# Patient Record
Sex: Female | Born: 1950
Health system: Southern US, Community
[De-identification: ages and names within clinical notes are randomized; demographics above are authoritative.]

## PROBLEM LIST (undated history)

## (undated) DIAGNOSIS — J449 Chronic obstructive pulmonary disease, unspecified: Secondary | ICD-10-CM

## (undated) DIAGNOSIS — C439 Malignant melanoma of skin, unspecified: Secondary | ICD-10-CM

## (undated) DIAGNOSIS — J309 Allergic rhinitis, unspecified: Secondary | ICD-10-CM

## (undated) DIAGNOSIS — D509 Iron deficiency anemia, unspecified: Secondary | ICD-10-CM

## (undated) DIAGNOSIS — J189 Pneumonia, unspecified organism: Secondary | ICD-10-CM

## (undated) DIAGNOSIS — F17200 Nicotine dependence, unspecified, uncomplicated: Secondary | ICD-10-CM

## (undated) DIAGNOSIS — M810 Age-related osteoporosis without current pathological fracture: Secondary | ICD-10-CM

## (undated) DIAGNOSIS — M1990 Unspecified osteoarthritis, unspecified site: Secondary | ICD-10-CM

## (undated) DIAGNOSIS — C4491 Basal cell carcinoma of skin, unspecified: Secondary | ICD-10-CM

## (undated) DIAGNOSIS — K2970 Gastritis, unspecified, without bleeding: Secondary | ICD-10-CM

## (undated) DIAGNOSIS — K219 Gastro-esophageal reflux disease without esophagitis: Secondary | ICD-10-CM

## (undated) DIAGNOSIS — F419 Anxiety disorder, unspecified: Secondary | ICD-10-CM

## (undated) HISTORY — DX: Age-related osteoporosis without current pathological fracture: M81.0

## (undated) HISTORY — DX: Iron deficiency anemia, unspecified: D50.9

## (undated) HISTORY — DX: Nicotine dependence, unspecified, uncomplicated: F17.200

## (undated) HISTORY — DX: Malignant melanoma of skin, unspecified: C43.9

## (undated) HISTORY — DX: Allergic rhinitis, unspecified: J30.9

## (undated) HISTORY — PX: MELANOMA EXCISION: SHX5266

## (undated) HISTORY — DX: Unspecified osteoarthritis, unspecified site: M19.90

## (undated) HISTORY — DX: Anxiety disorder, unspecified: F41.9

## (undated) HISTORY — DX: Chronic obstructive pulmonary disease, unspecified: J44.9

## (undated) HISTORY — DX: Gastritis, unspecified, without bleeding: K29.70

## (undated) HISTORY — PX: EYE SURGERY: SHX253

---

## 1999-11-24 ENCOUNTER — Other Ambulatory Visit: Admission: RE | Admit: 1999-11-24 | Discharge: 1999-11-24 | Payer: Self-pay | Admitting: Gynecology

## 2001-01-29 ENCOUNTER — Other Ambulatory Visit: Admission: RE | Admit: 2001-01-29 | Discharge: 2001-01-29 | Payer: Self-pay | Admitting: Gynecology

## 2002-01-30 ENCOUNTER — Encounter: Admission: RE | Admit: 2002-01-30 | Discharge: 2002-01-30 | Payer: Self-pay | Admitting: Gynecology

## 2002-01-30 ENCOUNTER — Encounter: Payer: Self-pay | Admitting: Gynecology

## 2002-07-22 ENCOUNTER — Other Ambulatory Visit: Admission: RE | Admit: 2002-07-22 | Discharge: 2002-07-22 | Payer: Self-pay | Admitting: Gynecology

## 2003-04-15 ENCOUNTER — Ambulatory Visit (HOSPITAL_COMMUNITY): Admission: RE | Admit: 2003-04-15 | Discharge: 2003-04-15 | Payer: Self-pay | Admitting: Gynecology

## 2003-04-15 ENCOUNTER — Ambulatory Visit (HOSPITAL_BASED_OUTPATIENT_CLINIC_OR_DEPARTMENT_OTHER): Admission: RE | Admit: 2003-04-15 | Discharge: 2003-04-15 | Payer: Self-pay | Admitting: Gynecology

## 2003-04-15 ENCOUNTER — Encounter (INDEPENDENT_AMBULATORY_CARE_PROVIDER_SITE_OTHER): Payer: Self-pay | Admitting: Specialist

## 2003-10-29 ENCOUNTER — Other Ambulatory Visit: Admission: RE | Admit: 2003-10-29 | Discharge: 2003-10-29 | Payer: Self-pay | Admitting: Gynecology

## 2005-02-07 ENCOUNTER — Other Ambulatory Visit: Admission: RE | Admit: 2005-02-07 | Discharge: 2005-02-07 | Payer: Self-pay | Admitting: Gynecology

## 2005-08-31 ENCOUNTER — Encounter: Admission: RE | Admit: 2005-08-31 | Discharge: 2005-08-31 | Payer: Self-pay | Admitting: Gynecology

## 2006-07-24 ENCOUNTER — Other Ambulatory Visit: Admission: RE | Admit: 2006-07-24 | Discharge: 2006-07-24 | Payer: Self-pay | Admitting: Gynecology

## 2010-02-21 ENCOUNTER — Encounter: Payer: Self-pay | Admitting: Gynecology

## 2010-06-18 NOTE — Op Note (Signed)
Linda Davies, Linda Davies                          ACCOUNT NO.:  [id number]   MEDICAL RECORD NO.:  [id number]                   PATIENT TYPE:  AMB   LOCATION:  [facility]                                 FACILITY:  [facility]   PHYSICIAN:  Gretta Cool, M.D.              DATE OF BIRTH:  Aug 08, 1950   DATE OF PROCEDURE:  04/15/2003  DATE OF DISCHARGE:                                 OPERATIVE REPORT   PREOPERATIVE DIAGNOSIS:  Endometrial polyp with abnormal uterine bleeding.   POSTOPERATIVE DIAGNOSIS:  Endometrial polyp with abnormal uterine bleeding.   PROCEDURE:  Hysteroscopy, resection of endometrial polyp, and total  endometrial resection for ablation.   SURGEON:  Gretta Cool, M.D.   ANESTHESIA:  IV sedation and paracervical block.   DESCRIPTION OF PROCEDURE:  Under excellent IV sedation as above with the  patient prepped and draped in Allen's stirrups, the cervix was grasped with  a single tooth tenaculum and progressively dilated with a series of Pratt  dilators to 7 mm #33 Pratt.  At this point, the resectoscope was then  introduced into the endometrial cavity and the cavity photographed.  A large  polyp on the posterior wall of the uterus was identified and resected.  Next, the entire endometrial cavity was resected 360 degrees around the  cavity, and extending a distance of at least  5 mm out into the myometrial tissue.  Once the entire endometrial cavity had  been resected, the corneal areas were treated by touch technique with  Vapotrode and the entire endometrial cavity was then treated by Vapotrode  electrodes, so as to eliminate any islands of viable endometrial tissue.  At  this point, the procedure was terminated without complication.  There was no  significant bleeding or reduced pressure.  The patient returned to the  recovery room in excellent condition.                                               Gretta Cool, M.D.    CWL/MEDQ  D:  04/15/2003  T:  04/15/2003   Job:  [id number]

## 2010-07-05 ENCOUNTER — Other Ambulatory Visit: Payer: Self-pay | Admitting: Gynecology

## 2011-08-23 ENCOUNTER — Other Ambulatory Visit: Payer: Self-pay | Admitting: Gynecology

## 2016-07-04 ENCOUNTER — Ambulatory Visit (HOSPITAL_BASED_OUTPATIENT_CLINIC_OR_DEPARTMENT_OTHER)
Admission: RE | Admit: 2016-07-04 | Discharge: 2016-07-04 | Disposition: A | Discharge status: Home / Self Care | Payer: BC Managed Care – PPO | Source: Ambulatory Visit | Attending: Physician Assistant | Admitting: Physician Assistant

## 2016-07-04 ENCOUNTER — Ambulatory Visit (INDEPENDENT_AMBULATORY_CARE_PROVIDER_SITE_OTHER): Payer: BC Managed Care – PPO | Admitting: Physician Assistant

## 2016-07-04 ENCOUNTER — Encounter: Payer: Self-pay | Admitting: Physician Assistant

## 2016-07-04 VITALS — BP 114/76 | HR 78 | Temp 98.2°F | Resp 18 | Ht 63.39 in | Wt 123.4 lb

## 2016-07-04 DIAGNOSIS — M7989 Other specified soft tissue disorders: Secondary | ICD-10-CM

## 2016-07-04 DIAGNOSIS — M79661 Pain in right lower leg: Secondary | ICD-10-CM

## 2016-07-04 DIAGNOSIS — T148XXA Other injury of unspecified body region, initial encounter: Secondary | ICD-10-CM

## 2016-07-04 NOTE — Patient Instructions (Addendum)
  [place] [facility]  215 PM  [address] [place], [place] [address]   IF you received an x-ray today, you will receive an invoice from [facility]. Please contact [facility] at [phone] with questions or concerns regarding your invoice.   IF you received labwork today, you will receive an invoice from [facility]. Please contact [facility] at [phone] with questions or concerns regarding your invoice.   Our billing staff will not be able to assist you with questions regarding bills from these companies.  You will be contacted with the lab results as soon as they are available. The fastest way to get your results is to activate your My Chart account. Instructions are located on the last page of this paperwork. If you have not heard from [place] regarding the results in 2 weeks, please contact this office.

## 2016-07-04 NOTE — Progress Notes (Signed)
Linda Davies  MRN: [id number] DOB: [birth year]  PCP: Patient, No Pcp Per  Chief Complaint  Patient presents with  . Leg Pain    right leg calf is swollen and sore  x1 week    Subjective:  Pt presents to clinic for pain in her right calf for the last week but increase swelling over the last several days.  She has been using ice and advil and elevation that seems to help with the swelling a little.  Having to change her gait due to pain.  She did not have an injury but when she was walking up and down the steps and felt something like a sting in her calf but nothing that stopped her from continuing to work.  She has been resting and elevating and when she does it feels better - she has also been wearing a compression sock.  She has noticed some swelling in her ankle but she has no pain there.  Drove home from beach (3.5 hours) 1 week ago - no h/o blood clot, non smoker. No estrogen, no family hx clotting disorders  Review of Systems  Constitutional: Negative for chills and fever.  Respiratory: Negative for cough and shortness of breath.   Cardiovascular: Positive for leg swelling. Negative for chest pain.  Psychiatric/Behavioral: Positive for sleep disturbance (due to pain when she touches her right calf with movement in sleep ).    There are no active problems to display for this patient.   No current outpatient prescriptions on file prior to visit.   No current facility-administered medications on file prior to visit.     No Known Allergies  Pt patients past, family and social history were reviewed and updated.   Objective:  BP 114/76   Pulse 78   Temp 98.2 F (36.8 C) (Oral)   Resp 18   Ht 5' 3.39" (1.61 m)   Wt 123 lb 6.4 oz (56 kg)   SpO2 97%   BMI 21.59 kg/m   Physical Exam  Constitutional: She is oriented to person, place, and time and well-developed, well-nourished, and in no distress.  HENT:  Head: Normocephalic and atraumatic.  Right Ear: Hearing and  external ear normal.  Left Ear: Hearing and external ear normal.  Eyes: Conjunctivae are normal.  Neck: Normal range of motion.  Cardiovascular: Normal rate, regular rhythm and normal heart sounds.   No murmur heard. Pulmonary/Chest: Effort normal and breath sounds normal. She has no wheezes.  Musculoskeletal:  Left calf visible larger than the right, no ecchymosis, no erythema or warmth - TTP along the gastrocnemious but palpable cord.  Achilles tender intact with good strength. Neg Homans.  Left calf at 9 cm is 31 cm  At 5 cm 32 cm  Right calf at 9 am is 35 cm at 5 cm 36.5 cm  Neurological: She is alert and oriented to person, place, and time. Gait normal.  Skin: Skin is warm and dry.  Psychiatric: Mood, memory, affect and judgment normal.  Vitals reviewed.   US Venous Img Lower Unilateral Right  Result Date: 07/04/2016 CLINICAL DATA:  Acute right calf pain and swelling for 1 week EXAM: RIGHT LOWER EXTREMITY VENOUS DOPPLER ULTRASOUND TECHNIQUE: Gray-scale sonography with graded compression, as well as color Doppler and duplex ultrasound were performed to evaluate the lower extremity deep venous systems from the level of the common femoral vein and including the common femoral, femoral, profunda femoral, popliteal and calf veins including the posterior tibial, peroneal and gastrocnemius  veins when visible. The superficial great saphenous vein was also interrogated. Spectral Doppler was utilized to evaluate flow at rest and with distal augmentation maneuvers in the common femoral, femoral and popliteal veins. COMPARISON:  None. FINDINGS: Contralateral Common Femoral Vein: Respiratory phasicity is normal and symmetric with the symptomatic side. No evidence of thrombus. Normal compressibility. Common Femoral Vein: No evidence of thrombus. Normal compressibility, respiratory phasicity and response to augmentation. Saphenofemoral Junction: No evidence of thrombus. Normal compressibility and flow on  color Doppler imaging. Profunda Femoral Vein: No evidence of thrombus. Normal compressibility and flow on color Doppler imaging. Femoral Vein: No evidence of thrombus. Normal compressibility, respiratory phasicity and response to augmentation. Popliteal Vein: No evidence of thrombus. Normal compressibility, respiratory phasicity and response to augmentation. Calf Veins: No evidence of thrombus. Normal compressibility and flow on color Doppler imaging. Superficial Great Saphenous Vein: No evidence of thrombus. Normal compressibility and flow on color Doppler imaging. Venous Reflux:  None. Other Findings: Posterior right calf complex elongated hypoechoic and partially cystic area measures 9.6 x 2.1 x 6.4 cm. No associated vascularity. Appearance compatible with soft tissue hematoma. IMPRESSION: No evidence of DVT within the right lower extremity. Findings compatible with posterior right calf soft tissue hematoma. Electronically Signed   By: Jerilynn Mages.  Shick M.D.   On: 07/04/2016 15:23    Assessment and Plan :  Right leg swelling - Plan: US Venous Img Lower Unilateral Right, Care order/instruction:, CANCELED: VAS [place] LOWER EXTREMITY VENOUS (DVT), CANCELED: US Venous Img Lower Unilateral Right  Right calf pain - Plan: US Venous Img Lower Unilateral Right, CANCELED: VAS [place] LOWER EXTREMITY VENOUS (DVT), CANCELED: US Venous Img Lower Unilateral Right  Hematoma from [place] - ? Muscle tear when she was going up and down steps and felt a pull sensation.  Called and spoke patient - she will use compression and heat and elevation - she will recheck with me in a month and at that time we will likely [place] to make sure it is resolved and there is no cause of spontaneous bleed though likely this related to an injury while going up steps.  D/w pt RTC precautions with erythema, warmth or increase pain in the calf area.  Windell Hummingbird PA-C  [facility] 07/04/2016 9:33 PM

## 2017-05-01 DIAGNOSIS — D509 Iron deficiency anemia, unspecified: Secondary | ICD-10-CM

## 2017-05-01 HISTORY — DX: Iron deficiency anemia, unspecified: D50.9

## 2017-05-10 ENCOUNTER — Ambulatory Visit: Payer: BC Managed Care – PPO | Admitting: Family Medicine

## 2017-05-10 ENCOUNTER — Encounter: Payer: Self-pay | Admitting: Family Medicine

## 2017-05-10 VITALS — BP 124/62 | HR 78 | Temp 98.0°F | Resp 16 | Ht 64.25 in | Wt 118.0 lb

## 2017-05-10 DIAGNOSIS — R1013 Epigastric pain: Secondary | ICD-10-CM | POA: Diagnosis not present

## 2017-05-10 DIAGNOSIS — F411 Generalized anxiety disorder: Secondary | ICD-10-CM

## 2017-05-10 LAB — CBC WITH DIFFERENTIAL/PLATELET
BASOS ABS: 0 10*3/uL (ref 0.0–0.1)
BASOS PCT: 0.6 % (ref 0.0–3.0)
EOS ABS: 0.1 10*3/uL (ref 0.0–0.7)
Eosinophils Relative: 2.2 % (ref 0.0–5.0)
HEMATOCRIT: 32 % — AB (ref 36.0–46.0)
Hemoglobin: 10.4 g/dL — ABNORMAL LOW (ref 12.0–15.0)
LYMPHS PCT: 25.9 % (ref 12.0–46.0)
Lymphs Abs: 1.5 10*3/uL (ref 0.7–4.0)
MCHC: 32.5 g/dL (ref 30.0–36.0)
MCV: 83.7 fl (ref 78.0–100.0)
MONOS PCT: 14.5 % — AB (ref 3.0–12.0)
Monocytes Absolute: 0.8 10*3/uL (ref 0.1–1.0)
Neutro Abs: 3.2 10*3/uL (ref 1.4–7.7)
Neutrophils Relative %: 56.8 % (ref 43.0–77.0)
Platelets: 283 10*3/uL (ref 150.0–400.0)
RBC: 3.83 Mil/uL — ABNORMAL LOW (ref 3.87–5.11)
RDW: 15.7 % — AB (ref 11.5–15.5)
WBC: 5.7 10*3/uL (ref 4.0–10.5)

## 2017-05-10 LAB — COMPREHENSIVE METABOLIC PANEL
ALT: 19 U/L (ref 0–35)
AST: 27 U/L (ref 0–37)
Albumin: 4 g/dL (ref 3.5–5.2)
Alkaline Phosphatase: 60 U/L (ref 39–117)
BILIRUBIN TOTAL: 0.3 mg/dL (ref 0.2–1.2)
BUN: 10 mg/dL (ref 6–23)
CALCIUM: 9.4 mg/dL (ref 8.4–10.5)
CO2: 31 mEq/L (ref 19–32)
CREATININE: 0.92 mg/dL (ref 0.40–1.20)
Chloride: 103 mEq/L (ref 96–112)
GFR: 65.26 mL/min (ref >60.00)
Glucose, Bld: 85 mg/dL (ref 70–99)
Potassium: 4.4 mEq/L (ref 3.5–5.1)
Sodium: 139 mEq/L (ref 135–145)
Total Protein: 6.4 g/dL (ref 6.0–8.3)

## 2017-05-10 MED ORDER — ALPRAZOLAM 0.5 MG PO TABS: ORAL_TABLET | ORAL | 1 refills | Status: DC

## 2017-05-10 MED ORDER — PANTOPRAZOLE SODIUM 40 MG PO TBEC: 40.0000 mg | DELAYED_RELEASE_TABLET | Freq: Every day | ORAL | 0 refills | Status: DC

## 2017-05-10 NOTE — Addendum Note (Signed)
Addended by: Tammi Sou on: 05/10/2017 01:52 PM   Modules accepted: Orders

## 2017-05-10 NOTE — Progress Notes (Addendum)
Office Note 05/10/2017  CC:  Chief Complaint  Patient presents with  . Establish Care    HPI:  Linda Davies is a 67 y.o. female who is here to establish care and discuss abdominal complaint. Patient's most recent primary MD: none Old records were not reviewed prior to or during today's visit.  Pt has not had any regular medical care in years. Occ visit at [facility].  Last pap was approx 6 yrs ago. Last mammogram 5 yrs ago. Unknown last tetanus. Colonscopy never.  Historically she has been able to eat anything w/out problem.  Then she had some insomnia a couple years ago and cut out red meat. Now, for the last month she has epigastric pain, and this also extended into mid/lower abdomen. No n/v.  Appetite good.  Eating food not consistently making it better or worse.  No fevers.  No abnl wt loss. BMs were regularly occurring and formed.  No constipation or diarrhea.  Takes 2 advil nightly for LBP and occ dose in daytime.  Occ mild bloating, no signif abd distention.  Increased passing gas per rectum and burping.  Started protein powder in coffee but not really linked in time to onset of sx's 1 mo ago. Then onset of diarrhea x 4d ago after eating japanese food, about 3 loose brown BMs per day, better with recent use of imodium. Takes zantac and tums.  Past Medical History:  Diagnosis Date  . Allergic rhinitis   . Anxiety   . Arthritis    low back and both hands  . Melanoma ([place])    Near umbilicus. close f/u with Dr. Delman Cheadle (annual)  . Tobacco dependence     Past Surgical History:  Procedure Laterality Date  . CESAREAN SECTION    . EYE SURGERY    . MELANOMA EXCISION      Family History  Problem Relation Age of Onset  . Arthritis Mother   . Asthma Mother   . COPD Mother   . Hypertension Mother   . Cancer Father        liver ca  . Alcohol abuse Father   . Alzheimer's disease Father   . Cancer Brother        Leukemia 2017  . Hearing loss Brother   . Early death Brother    . Alcohol abuse Maternal Grandmother   . Cancer Maternal Grandmother   . Cancer Maternal Grandfather        lung  . Alzheimer's disease Paternal Grandmother   . Cancer Paternal Grandfather        lung  . Asthma Daughter     Social History   Socioeconomic History  . Marital status: Married    Spouse name: Not on file  . Number of children: Not on file  . Years of education: Not on file  . Highest education level: Not on file  Occupational History  . Not on file  Social Needs  . Financial resource strain: Not on file  . Food insecurity:    Worry: Not on file    Inability: Not on file  . Transportation needs:    Medical: Not on file    Non-medical: Not on file  Tobacco Use  . Smoking status: Current Every Day Smoker    Packs/day: 0.50    Years: 30.00    Pack years: 15.00    Types: Cigarettes  . Smokeless tobacco: Never Used  Substance and Sexual Activity  . Alcohol use: Yes    Alcohol/week:  1.2 oz    Types: 2 Glasses of wine per week  . Drug use: Never  . Sexual activity: Not on file  Lifestyle  . Physical activity:    Days per week: Not on file    Minutes per session: Not on file  . Stress: Not on file  Relationships  . Social connections:    Talks on phone: Not on file    Gets together: Not on file    Attends religious service: Not on file    Active member of club or organization: Not on file    Attends meetings of clubs or organizations: Not on file    Relationship status: Not on file  . Intimate partner violence:    Fear of current or ex partner: Not on file    Emotionally abused: Not on file    Physically abused: Not on file    Forced sexual activity: Not on file  Other Topics Concern  . Not on file  Social History Narrative   Married (her pt is a husband of mine as well), 3 children, 1 grand child.   Educ: college.   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   Tob: current as of 05/2017; 15 pack-yr hx.   FAO:[id number]    Outpatient Encounter  Medications as of 05/10/2017  Medication Sig  . cholecalciferol (VITAMIN D) 1000 units tablet Take 1,000 Units by mouth daily.  . Multiple Vitamin (MULTIVITAMIN) tablet Take 1 tablet by mouth daily.  . Turmeric 400 MG CAPS Take by mouth.  . vitamin C (ASCORBIC ACID) 500 MG tablet Take 500 mg by mouth daily.  Marland Kitchen ALPRAZolam (XANAX) 0.25 MG tablet Take 0.25 mg by mouth at bedtime as needed for anxiety.  . pantoprazole (PROTONIX) 40 MG tablet Take 1 tablet (40 mg total) by mouth daily.   No facility-administered encounter medications on file as of 05/10/2017.     Allergies  Allergen Reactions  . Latex Rash    ROS Review of Systems  Constitutional: Negative for appetite change, chills, fatigue and fever.  HENT: Negative for congestion, dental problem, ear pain and sore throat.   Eyes: Negative for discharge, redness and visual disturbance.  Respiratory: Negative for cough, chest tightness, shortness of breath and wheezing.   Cardiovascular: Negative for chest pain, palpitations and leg swelling.  Gastrointestinal: Positive for abdominal pain (see hpi) and diarrhea (see hpi). Negative for blood in stool, nausea and vomiting.  Genitourinary: Negative for difficulty urinating, dysuria, flank pain, frequency, hematuria and urgency.  Musculoskeletal: Positive for back pain (chronic low back). Negative for arthralgias, joint swelling, myalgias and neck stiffness.  Skin: Negative for pallor and rash.  Neurological: Negative for dizziness, speech difficulty, weakness and headaches.  Hematological: Negative for adenopathy. Does not bruise/bleed easily.  Psychiatric/Behavioral: Negative for confusion and sleep disturbance. The patient is nervous/anxious.     PE; Blood pressure 124/62, pulse 78, temperature 98 F (36.7 C), temperature source Oral, resp. rate 16, height 5' 4.25" (1.632 m), weight 118 lb (53.5 kg), SpO2 97 %.  Exam chaperoned by Starla Link, CMA.  Gen: Alert, well appearing.  Patient  is oriented to person, place, time, and situation. AFFECT: pleasant, lucid thought and speech. CV: RRR, no m/r/g.   LUNGS: CTA bilat, nonlabored resps, good aeration in all lung fields. ABD: soft, NT, ND, BS normal.  No hepatospenomegaly or mass.  No bruits. EXT: no clubbing, cyanosis, or edema.   Pertinent labs:  None today  ASSESSMENT AND PLAN:  New pt; no prior PCP to get records from. GYN: used to see Dr. Ubaldo Glassing.  1) Abd pain most consistent with dyspepsia vs acute gastritis. Exam today normal, so gastritis less likely. Recommended she stop ibuprofen, take tylenol [id number] mg tid prn pain. Try to cut WAY back or STOP smoking. Take pantoprazole 40mg  qAM (rx today) and otc zantac 150 mg qhs. She is quite anxious and has been helped a lot by prn alprazolam in the past---I think this would also help her current sx's.  Will rx alprazolam 0.5mg , 1/2-1 bid prn, #30, no RF. We'll see how she responds to this treatment.  F/u 2 wks. Check CBC w diff and CMET today.  2) Preventative health: she'll return for CPE in 2 wks and we'll offer Tdap, discuss pap/pelvic plan, breast ca screening plan, colon cancer screening plan, and bone density screening plan.  An After Visit Summary was printed and given to the patient.  Return in about 2 weeks (around 05/24/2017) for Fasting CPE + recheck dyspepsia.  Signed:  [name], MD           05/10/2017

## 2017-05-10 NOTE — Patient Instructions (Signed)
Take over the counter generic 150mg  around bedtime every night.

## 2017-05-11 ENCOUNTER — Encounter: Payer: Self-pay | Admitting: Family Medicine

## 2017-05-11 ENCOUNTER — Other Ambulatory Visit (INDEPENDENT_AMBULATORY_CARE_PROVIDER_SITE_OTHER): Payer: BC Managed Care – PPO

## 2017-05-11 DIAGNOSIS — D649 Anemia, unspecified: Secondary | ICD-10-CM | POA: Diagnosis not present

## 2017-05-11 LAB — IBC PANEL
IRON: 16 ug/dL — AB (ref 42–145)
Saturation Ratios: 3.5 % — ABNORMAL LOW (ref 20.0–50.0)
Transferrin: 328 mg/dL (ref 212.0–360.0)

## 2017-05-11 LAB — FOLATE

## 2017-05-11 LAB — FERRITIN: Ferritin: 8.7 ng/mL — ABNORMAL LOW (ref 10.0–291.0)

## 2017-05-11 LAB — VITAMIN B12: Vitamin B-12: >1500 pg/mL — ABNORMAL HIGH (ref 211–911)

## 2017-05-12 ENCOUNTER — Other Ambulatory Visit: Payer: Self-pay | Admitting: *Deleted

## 2017-05-12 DIAGNOSIS — D509 Iron deficiency anemia, unspecified: Secondary | ICD-10-CM

## 2017-05-24 ENCOUNTER — Ambulatory Visit (INDEPENDENT_AMBULATORY_CARE_PROVIDER_SITE_OTHER): Payer: BC Managed Care – PPO | Admitting: Family Medicine

## 2017-05-24 ENCOUNTER — Encounter: Payer: Self-pay | Admitting: Family Medicine

## 2017-05-24 VITALS — BP 126/74 | HR 83 | Temp 97.9°F | Resp 16 | Ht 63.5 in | Wt 116.0 lb

## 2017-05-24 DIAGNOSIS — Z13220 Encounter for screening for lipoid disorders: Secondary | ICD-10-CM | POA: Diagnosis not present

## 2017-05-24 DIAGNOSIS — Z23 Encounter for immunization: Secondary | ICD-10-CM

## 2017-05-24 DIAGNOSIS — Z1159 Encounter for screening for other viral diseases: Secondary | ICD-10-CM | POA: Diagnosis not present

## 2017-05-24 DIAGNOSIS — Z124 Encounter for screening for malignant neoplasm of cervix: Secondary | ICD-10-CM

## 2017-05-24 DIAGNOSIS — Z1239 Encounter for other screening for malignant neoplasm of breast: Secondary | ICD-10-CM

## 2017-05-24 DIAGNOSIS — Z114 Encounter for screening for human immunodeficiency virus [HIV]: Secondary | ICD-10-CM

## 2017-05-24 DIAGNOSIS — Z9189 Other specified personal risk factors, not elsewhere classified: Secondary | ICD-10-CM

## 2017-05-24 DIAGNOSIS — F419 Anxiety disorder, unspecified: Secondary | ICD-10-CM | POA: Diagnosis not present

## 2017-05-24 DIAGNOSIS — D509 Iron deficiency anemia, unspecified: Secondary | ICD-10-CM

## 2017-05-24 DIAGNOSIS — Z1231 Encounter for screening mammogram for malignant neoplasm of breast: Secondary | ICD-10-CM

## 2017-05-24 DIAGNOSIS — Z0000 Encounter for general adult medical examination without abnormal findings: Secondary | ICD-10-CM

## 2017-05-24 LAB — LIPID PANEL
CHOLESTEROL: 163 mg/dL (ref 0–200)
HDL: 83.3 mg/dL (ref >39.00)
LDL Cholesterol: 62 mg/dL (ref 0–99)
NonHDL: 79.42
TRIGLYCERIDES: 87 mg/dL (ref 0.0–149.0)
Total CHOL/HDL Ratio: 2
VLDL: 17.4 mg/dL (ref 0.0–40.0)

## 2017-05-24 LAB — CBC
HCT: 35.7 % — ABNORMAL LOW (ref 36.0–46.0)
HEMOGLOBIN: 11.2 g/dL — AB (ref 12.0–15.0)
MCHC: 31.2 g/dL (ref 30.0–36.0)
MCV: 82.4 fl (ref 78.0–100.0)
PLATELETS: 380 10*3/uL (ref 150.0–400.0)
RBC: 4.34 Mil/uL (ref 3.87–5.11)
RDW: 18 % — AB (ref 11.5–15.5)
WBC: 6.3 10*3/uL (ref 4.0–10.5)

## 2017-05-24 LAB — TSH: TSH: 1.49 u[IU]/mL (ref 0.35–4.50)

## 2017-05-24 NOTE — Patient Instructions (Addendum)
Start over the counter iron tabs called ferrous sulfate '325mg'$ , 1 tab one-two times per day with food.Health    Maintenance, Female Adopting a healthy lifestyle and getting preventive care can go a long way to promote health and wellness. Talk with your health care provider about what schedule of regular examinations is right for you. This is a good chance for you to check in with your provider about disease prevention and staying healthy. In between checkups, there are plenty of things you can do on your own. Experts have done a lot of research about which lifestyle changes and preventive measures are most likely to keep you healthy. Ask your health care provider for more information. Weight and diet Eat a healthy diet  Be sure to include plenty of vegetables, fruits, low-fat dairy products, and lean protein.  Do not eat a lot of foods high in solid fats, added sugars, or salt.  Get regular exercise. This is one of the most important things you can do for your health. ? Most adults should exercise for at least 150 minutes each week. The exercise should increase your heart rate and make you sweat (moderate-intensity exercise). ? Most adults should also do strengthening exercises at least twice a week. This is in addition to the moderate-intensity exercise.  Maintain a healthy weight  Body mass index (BMI) is a measurement that can be used to identify possible weight problems. It estimates body fat based on height and weight. Your health care provider can help determine your BMI and help you achieve or maintain a healthy weight.  For females 61 years of age and older: ? A BMI below 18.5 is considered underweight. ? A BMI of 18.5 to 24.9 is normal. ? A BMI of 25 to 29.9 is considered overweight. ? A BMI of 30 and above is considered obese.  Watch levels of cholesterol and blood lipids  You should start having your blood tested for lipids and cholesterol at 65 years of age, then have this  test every 5 years.  You may need to have your cholesterol levels checked more often if: ? Your lipid or cholesterol levels are high. ? You are older than 65 years of age. ? You are at high risk for heart disease.  Cancer screening Lung Cancer  Lung cancer screening is recommended for adults 68-45 years old who are at high risk for lung cancer because of a history of smoking.  A yearly low-dose CT scan of the lungs is recommended for people who: ? Currently smoke. ? Have quit within the past 15 years. ? Have at least a 30-pack-year history of smoking. A pack year is smoking an average of one pack of cigarettes a day for 1 year.  Yearly screening should continue until it has been 15 years since you quit.  Yearly screening should stop if you develop a health problem that would prevent you from having lung cancer treatment.  Breast Cancer  Practice breast self-awareness. This means understanding how your breasts normally appear and feel.  It also means doing regular breast self-exams. Let your health care provider know about any changes, no matter how small.  If you are in your 20s or 30s, you should have a clinical breast exam (CBE) by a health care provider every 1-3 years as part of a regular health exam.  If you are 41 or older, have a CBE every year. Also consider having a breast X-ray (mammogram) every year.  If you have a family  history of breast cancer, talk to your health care provider about genetic screening.  If you are at high risk for breast cancer, talk to your health care provider about having an MRI and a mammogram every year.  Breast cancer gene (BRCA) assessment is recommended for women who have family members with BRCA-related cancers. BRCA-related cancers include: ? Breast. ? Ovarian. ? Tubal. ? Peritoneal cancers.  Results of the assessment will determine the need for genetic counseling and BRCA1 and BRCA2 testing.  Cervical Cancer Your health care  provider may recommend that you be screened regularly for cancer of the pelvic organs (ovaries, uterus, and vagina). This screening involves a pelvic examination, including checking for microscopic changes to the surface of your cervix (Pap test). You may be encouraged to have this screening done every 3 years, beginning at age 66.  For women ages 96-65, health care providers may recommend pelvic exams and Pap testing every 3 years, or they may recommend the Pap and pelvic exam, combined with testing for human papilloma virus (HPV), every 5 years. Some types of HPV increase your risk of cervical cancer. Testing for HPV may also be done on women of any age with unclear Pap test results.  Other health care providers may not recommend any screening for nonpregnant women who are considered low risk for pelvic cancer and who do not have symptoms. Ask your health care provider if a screening pelvic exam is right for you.  If you have had past treatment for cervical cancer or a condition that could lead to cancer, you need Pap tests and screening for cancer for at least 20 years after your treatment. If Pap tests have been discontinued, your risk factors (such as having a new sexual partner) need to be reassessed to determine if screening should resume. Some women have medical problems that increase the chance of getting cervical cancer. In these cases, your health care provider may recommend more frequent screening and Pap tests.  Colorectal Cancer  This type of cancer can be detected and often prevented.  Routine colorectal cancer screening usually begins at 65 years of age and continues through 65 years of age.  Your health care provider may recommend screening at an earlier age if you have risk factors for colon cancer.  Your health care provider may also recommend using home test kits to check for hidden blood in the stool.  A small camera at the end of a tube can be used to examine your colon  directly (sigmoidoscopy or colonoscopy). This is done to check for the earliest forms of colorectal cancer.  Routine screening usually begins at age 46.  Direct examination of the colon should be repeated every 5-10 years through 65 years of age. However, you may need to be screened more often if early forms of precancerous polyps or small growths are found.  Skin Cancer  Check your skin from head to toe regularly.  Tell your health care provider about any new moles or changes in moles, especially if there is a change in a mole's shape or color.  Also tell your health care provider if you have a mole that is larger than the size of a pencil eraser.  Always use sunscreen. Apply sunscreen liberally and repeatedly throughout the day.  Protect yourself by wearing long sleeves, pants, a wide-brimmed hat, and sunglasses whenever you are outside.  Heart disease, diabetes, and high blood pressure  High blood pressure causes heart disease and increases the risk of stroke.  High blood pressure is more likely to develop in: ? People who have blood pressure in the high end of the normal range (130-139/85-89 mm Hg). ? People who are overweight or obese. ? People who are African American.  If you are 69-78 years of age, have your blood pressure checked every 3-5 years. If you are 59 years of age or older, have your blood pressure checked every year. You should have your blood pressure measured twice-once when you are at a hospital or clinic, and once when you are not at a hospital or clinic. Record the average of the two measurements. To check your blood pressure when you are not at a hospital or clinic, you can use: ? An automated blood pressure machine at a pharmacy. ? A home blood pressure monitor.  If you are between 71 years and 74 years old, ask your health care provider if you should take aspirin to prevent strokes.  Have regular diabetes screenings. This involves taking a blood sample to  check your fasting blood sugar level. ? If you are at a normal weight and have a low risk for diabetes, have this test once every three years after 65 years of age. ? If you are overweight and have a high risk for diabetes, consider being tested at a younger age or more often. Preventing infection Hepatitis B  If you have a higher risk for hepatitis B, you should be screened for this virus. You are considered at high risk for hepatitis B if: ? You were born in a country where hepatitis B is common. Ask your health care provider which countries are considered high risk. ? Your parents were born in a high-risk country, and you have not been immunized against hepatitis B (hepatitis B vaccine). ? You have HIV or AIDS. ? You use needles to inject street drugs. ? You live with someone who has hepatitis B. ? You have had sex with someone who has hepatitis B. ? You get hemodialysis treatment. ? You take certain medicines for conditions, including cancer, organ transplantation, and autoimmune conditions.  Hepatitis C  Blood testing is recommended for: ? Everyone born from [birth year] through 1963. ? Anyone with known risk factors for hepatitis C.  Sexually transmitted infections (STIs)  You should be screened for sexually transmitted infections (STIs) including gonorrhea and chlamydia if: ? You are sexually active and are younger than 65 years of age. ? You are older than 65 years of age and your health care provider tells you that you are at risk for this type of infection. ? Your sexual activity has changed since you were last screened and you are at an increased risk for chlamydia or gonorrhea. Ask your health care provider if you are at risk.  If you do not have HIV, but are at risk, it may be recommended that you take a prescription medicine daily to prevent HIV infection. This is called pre-exposure prophylaxis (PrEP). You are considered at risk if: ? You are sexually active and do not regularly  use condoms or know the HIV status of your partner(s). ? You take drugs by injection. ? You are sexually active with a partner who has HIV.  Talk with your health care provider about whether you are at high risk of being infected with HIV. If you choose to begin PrEP, you should first be tested for HIV. You should then be tested every 3 months for as long as you are taking PrEP. Pregnancy  If you  are premenopausal and you may become pregnant, ask your health care provider about preconception counseling.  If you may become pregnant, take 400 to 800 micrograms (mcg) of folic acid every day.  If you want to prevent pregnancy, talk to your health care provider about birth control (contraception). Osteoporosis and menopause  Osteoporosis is a disease in which the bones lose minerals and strength with aging. This can result in serious bone fractures. Your risk for osteoporosis can be identified using a bone density scan.  If you are 61 years of age or older, or if you are at risk for osteoporosis and fractures, ask your health care provider if you should be screened.  Ask your health care provider whether you should take a calcium or vitamin D supplement to lower your risk for osteoporosis.  Menopause may have certain physical symptoms and risks.  Hormone replacement therapy may reduce some of these symptoms and risks. Talk to your health care provider about whether hormone replacement therapy is right for you. Follow these instructions at home:  Schedule regular health, dental, and eye exams.  Stay current with your immunizations.  Do not use any tobacco products including cigarettes, chewing tobacco, or electronic cigarettes.  If you are pregnant, do not drink alcohol.  If you are breastfeeding, limit how much and how often you drink alcohol.  Limit alcohol intake to no more than 1 drink per day for nonpregnant women. One drink equals 12 ounces of beer, 5 ounces of wine, or 1 ounces  of hard liquor.  Do not use street drugs.  Do not share needles.  Ask your health care provider for help if you need support or information about quitting drugs.  Tell your health care provider if you often feel depressed.  Tell your health care provider if you have ever been abused or do not feel safe at home. This information is not intended to replace advice given to you by your health care provider. Make sure you discuss any questions you have with your health care provider. Document Released: 08/02/2010 Document Revised: 06/25/2015 Document Reviewed: 10/21/2014 Elsevier Interactive Patient Education  [employer].

## 2017-05-24 NOTE — Addendum Note (Signed)
Addended byCallie Fielding on: 05/24/2017 09:44 AM   Modules accepted: Orders

## 2017-05-24 NOTE — Progress Notes (Signed)
Office Note 05/24/2017  CC:  Chief Complaint  Patient presents with  . Annual Exam    Pt is fasting.     HPI:  Linda Davies is a 67 y.o. female who is here for annual health maintenance exam + recheck dyspepsia/gastritis. Last visit recently she had iron def anemia in the context of recent epigastric pain. Hemoccults x 3 ordered but no receipt of these yet. Started daily PPI, encouraged pt to stop smoking, and rx'd alprazolam to help her significant anxiety---esp surrounding MD visits and fear of medical problems.  Taking alprazolam every few days.  Her epigastric pain is completely resolved with taking her PPI!  Sleeping and eating w/out any sx's.  No cutting back on smoking since last visit.  Diet: healthy, limits red meat. Exercise: active but no formal regimen. Dental: preventatives UTD. Eyes; just got exam.   Past Medical History:  Diagnosis Date  . Allergic rhinitis   . Anxiety   . Arthritis    low back and both hands  . Iron deficiency anemia 05/2017   Hemoccults pending as of 05/11/17.  . Melanoma ([place])    Near umbilicus. close f/u with Dr. Delman Cheadle (annual)  . Tobacco dependence     Past Surgical History:  Procedure Laterality Date  . CESAREAN SECTION    . EYE SURGERY    . MELANOMA EXCISION      Family History  Problem Relation Age of Onset  . Arthritis Mother   . Asthma Mother   . COPD Mother   . Hypertension Mother   . Cancer Father        liver ca  . Alcohol abuse Father   . Alzheimer's disease Father   . Cancer Brother        Leukemia 2017  . Hearing loss Brother   . Early death Brother   . Alcohol abuse Maternal Grandmother   . Cancer Maternal Grandmother   . Cancer Maternal Grandfather        lung  . Alzheimer's disease Paternal Grandmother   . Cancer Paternal Grandfather        lung  . Asthma Daughter     Social History   Socioeconomic History  . Marital status: Married    Spouse name: Not on file  . Number of children: Not on  file  . Years of education: Not on file  . Highest education level: Not on file  Occupational History  . Not on file  Social Needs  . Financial resource strain: Not on file  . Food insecurity:    Worry: Not on file    Inability: Not on file  . Transportation needs:    Medical: Not on file    Non-medical: Not on file  Tobacco Use  . Smoking status: Current Every Day Smoker    Packs/day: 0.50    Years: 30.00    Pack years: 15.00    Types: Cigarettes  . Smokeless tobacco: Never Used  Substance and Sexual Activity  . Alcohol use: Yes    Alcohol/week: 1.2 oz    Types: 2 Glasses of wine per week  . Drug use: Never  . Sexual activity: Not on file  Lifestyle  . Physical activity:    Days per week: Not on file    Minutes per session: Not on file  . Stress: Not on file  Relationships  . Social connections:    Talks on phone: Not on file    Gets together: Not on file  Attends religious service: Not on file    Active member of club or organization: Not on file    Attends meetings of clubs or organizations: Not on file    Relationship status: Not on file  . Intimate partner violence:    Fear of current or ex partner: Not on file    Emotionally abused: Not on file    Physically abused: Not on file    Forced sexual activity: Not on file  Other Topics Concern  . Not on file  Social History Narrative   Married (her pt is a husband of mine as well), 3 children, 1 grand child.   Educ: college.   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   Tob: current as of 05/2017; 15 pack-yr hx.   [id number]    Outpatient Medications Prior to Visit  Medication Sig Dispense Refill  . ALPRAZolam (XANAX) 0.5 MG tablet 1/-2 tab po bid prn anxiety 30 tablet 1  . cholecalciferol (VITAMIN D) 1000 units tablet Take 1,000 Units by mouth daily.    . Multiple Vitamin (MULTIVITAMIN) tablet Take 1 tablet by mouth daily.    . pantoprazole (PROTONIX) 40 MG tablet Take 1 tablet (40 mg total) by mouth  daily. 30 tablet 0  . Turmeric 400 MG CAPS Take by mouth.    . vitamin C (ASCORBIC ACID) 500 MG tablet Take 500 mg by mouth daily.     No facility-administered medications prior to visit.     Allergies  Allergen Reactions  . Latex Rash    ROS Review of Systems  Constitutional: Negative for appetite change, chills, fatigue and fever.  HENT: Negative for congestion, dental problem, ear pain and sore throat.   Eyes: Negative for discharge, redness and visual disturbance.  Respiratory: Negative for cough, chest tightness, shortness of breath and wheezing.   Cardiovascular: Negative for chest pain, palpitations and leg swelling.  Gastrointestinal: Negative for abdominal pain, blood in stool, diarrhea, nausea and vomiting.  Genitourinary: Negative for difficulty urinating, dysuria, flank pain, frequency, hematuria, urgency and vaginal bleeding.  Musculoskeletal: Negative for arthralgias, back pain, joint swelling, myalgias and neck stiffness.  Skin: Negative for pallor and rash.  Neurological: Negative for dizziness, speech difficulty, weakness and headaches.  Hematological: Negative for adenopathy. Does not bruise/bleed easily.  Psychiatric/Behavioral: Negative for confusion and sleep disturbance. The patient is nervous/anxious (chronic).     PE; Blood pressure 126/74, pulse 83, temperature 97.9 F (36.6 C), temperature source Oral, resp. rate 16, height 5' 3.5" (1.613 m), weight 116 lb (52.6 kg), SpO2 100 %. Body mass index is 20.23 kg/m.  Pt examined with Helayne Seminole, CMA, as chaperone.  Gen: Alert, well appearing.  Patient is oriented to person, place, time, and situation. AFFECT: pleasant, lucid thought and speech. ENT: Ears: EACs clear, normal epithelium.  TMs with good light reflex and landmarks bilaterally.  Eyes: no injection, icteris, swelling, or exudate.  EOMI, PERRLA. Nose: no drainage or turbinate edema/swelling.  No injection or focal lesion.  Mouth: lips without  lesion/swelling.  Oral mucosa pink and moist.  Dentition intact and without obvious caries or gingival swelling.  Oropharynx without erythema, exudate, or swelling.  Neck: supple/nontender.  No LAD, mass, or TM.  Carotid pulses 2+ bilaterally, without bruits. CV: RRR, no m/r/g.   LUNGS: CTA bilat, nonlabored resps, good aeration in all lung fields. ABD: soft, NT, ND, BS normal.  No hepatospenomegaly or mass.  No bruits. EXT: no clubbing, cyanosis, or edema.  Musculoskeletal: no joint swelling,  erythema, warmth, or tenderness.  ROM of all joints intact. Skin - no sores or suspicious lesions or rashes or color changes Neuro: CN 2-12 intact bilaterally, strength 5/5 in proximal and distal upper extremities and lower extremities bilaterally.  No sensory deficits.  No tremor.  No disdiadochokinesis.  No ataxia.  Upper extremity and lower extremity DTRs  (all 3+, plus 1 beat clonus each ankle).  No pronator drift.   Pertinent labs:  No results found for: TSH Lab Results  Component Value Date   WBC 5.7 05/10/2017   HGB 10.4 (L) 05/10/2017   HCT 32.0 (L) 05/10/2017   MCV 83.7 05/10/2017   PLT 283.0 05/10/2017   Lab Results  Component Value Date   IRON 16 (L) 05/11/2017   FERRITIN 8.7 (L) 05/11/2017    Lab Results  Component Value Date   CREATININE 0.92 05/10/2017   BUN 10 05/10/2017   NA 139 05/10/2017   K 4.4 05/10/2017   CL 103 05/10/2017   CO2 31 05/10/2017   Lab Results  Component Value Date   ALT 19 05/10/2017   AST 27 05/10/2017   ALKPHOS 60 05/10/2017   BILITOT 0.3 05/10/2017   No results found for: CHOL No results found for: HDL No results found for: LDLCALC No results found for: TRIG No results found for: CHOLHDL  ASSESSMENT AND PLAN:   1) Iron def anemia: hemoccults done --pt just sent these in but we have not received them yet. Start feSO4 325mg  1-2 times per day. GI referral ordered today.  2) GAD: she is doing well using alprazolam prn. Controlled  substance contract reviewed with patient today.  Patient signed this and it will be placed in the chart.   Will do UDS at a future visit.  3) Health maintenance exam: Reviewed age and gender appropriate health maintenance issues (prudent diet, regular exercise, health risks of tobacco and excessive alcohol, use of seatbelts, fire alarms in home, use of sunscreen).  Also reviewed age and gender appropriate health screening as well as vaccine recommendations. Vaccines: Tdap and shingrix today. Labs: CBC, FLP, TSH, HIV and Hep C screening. Cervical ca screening: last pap/pelvic approx 6 yrs ago-->GYN referral. Breast ca screening: last mammogram approx 5 yrs ago-->GYN referral. Colon ca screening: has never had this: discussed options today--> pt being referred to GI for her iron def anemia (home hemoccults pending) and will likely get colonoscopy at that time.  If not, then we'll do iFOB in future.  An After Visit Summary was printed and given to the patient.  FOLLOW UP:  Return in about 2 months (around 07/24/2017) for f/u iron def anemia.  Signed:  [name], MD           05/24/2017

## 2017-05-25 ENCOUNTER — Encounter: Payer: Self-pay | Admitting: Family Medicine

## 2017-05-25 LAB — HIV ANTIBODY (ROUTINE TESTING W REFLEX): HIV: NONREACTIVE

## 2017-05-25 LAB — HEPATITIS C ANTIBODY
Hepatitis C Ab: NONREACTIVE
SIGNAL TO CUT-OFF: 0.01 (ref <1.00)

## 2017-05-26 ENCOUNTER — Other Ambulatory Visit: Payer: BC Managed Care – PPO

## 2017-05-26 ENCOUNTER — Encounter: Payer: Self-pay | Admitting: Family Medicine

## 2017-05-26 DIAGNOSIS — D509 Iron deficiency anemia, unspecified: Secondary | ICD-10-CM

## 2017-05-26 LAB — HEMOCCULT SLIDES (X 3 CARDS)
Fecal Occult Blood: NEGATIVE
OCCULT 1: NEGATIVE
OCCULT 2: NEGATIVE
OCCULT 3: NEGATIVE
OCCULT 4: NEGATIVE
OCCULT 5: NEGATIVE

## 2017-05-29 ENCOUNTER — Encounter: Payer: Self-pay | Admitting: Gastroenterology

## 2017-05-29 ENCOUNTER — Encounter: Payer: Self-pay | Admitting: *Deleted

## 2017-06-08 ENCOUNTER — Other Ambulatory Visit: Payer: Self-pay | Admitting: Family Medicine

## 2017-07-09 ENCOUNTER — Other Ambulatory Visit: Payer: Self-pay | Admitting: Family Medicine

## 2017-07-10 NOTE — Telephone Encounter (Signed)
Rx faxed

## 2017-07-10 NOTE — Telephone Encounter (Signed)
RF request for alprazolam LOV: 05/24/17 Next ov: None Last written: 05/10/17 #30 w/ 1RF  Please advise. Thanks.

## 2017-07-19 ENCOUNTER — Ambulatory Visit: Payer: BC Managed Care – PPO | Admitting: Gastroenterology

## 2017-07-19 ENCOUNTER — Encounter: Payer: Self-pay | Admitting: Gastroenterology

## 2017-07-19 VITALS — BP 124/82 | HR 90 | Ht 63.5 in | Wt 115.5 lb

## 2017-07-19 DIAGNOSIS — R1013 Epigastric pain: Secondary | ICD-10-CM | POA: Diagnosis not present

## 2017-07-19 DIAGNOSIS — G8929 Other chronic pain: Secondary | ICD-10-CM | POA: Diagnosis not present

## 2017-07-19 DIAGNOSIS — D508 Other iron deficiency anemias: Secondary | ICD-10-CM

## 2017-07-19 NOTE — Patient Instructions (Signed)
You have been scheduled for an endoscopy and colonoscopy. Please follow the written instructions given to you at your visit today. Please pick up your prep supplies at the pharmacy within the next 1-3 days. If you use inhalers (even only as needed), please bring them with you on the day of your procedure. Your physician has requested that you go to www.[email or web address].[email or web address] and enter the access code given to you at your visit today. This web site gives a general overview about your procedure. However, you should still follow specific instructions given to you by our office regarding your preparation for the procedure.  We have given you a Plenvu sample prep kit today   You will need to come to our lab 1 week prior to your procedures to get lab work done  ([place])  If you are age 23 or older, your body mass index should be between 23-30. Your Body mass index is 20.14 kg/m. If this is out of the aforementioned range listed, please consider follow up with your Primary Care Provider.  If you are age 39 or younger, your body mass index should be between 19-25. Your Body mass index is 20.14 kg/m. If this is out of the aformentioned range listed, please consider follow up with your Primary Care Provider.

## 2017-07-19 NOTE — Progress Notes (Signed)
Linda Davies    [id number]    28-Feb-1950  Primary Care Physician:McGowen, Adrian Blackwater, MD  Referring Physician: Tammi Sou, MD [address] [place],  [address]  Chief complaint: Iron deficiency anemia HPI:  67 year old Caucasian female here for new patient visit for evaluation of recent onset iron deficiency anemia.  Patient had hemoglobin 10.4, hematocrit 32, MCV 84 and RDW 16 with low ferritin 8.7, iron saturation 3.5% with total iron of 16 on May 10, 2017.  Vitamin B12 was greater than 1500.  Repeat CBC showed hemoglobin of 11.2 and hematocrit 36.  Patient was started on oral iron supplements in April and she is currently taking 2 tablets daily.  Denies any dark stool, change in bowel habits or blood per rectum.  She is postmenopausal and has not had any menstrual blood loss for many years now and denies any blood in urine. Last year around summer time she had a large hematoma in her calf, injured herself while climbing up and [place].  Venous Doppler ultrasound was negative for DVT at the time.  No other labs available in epic other than the most recent from April 2019.  Fecal Hemoccult negative. She was having intermittent epigastric abdominal pain and dyspepsia symptoms 2 to 3 months ago, improved since starting Protonix daily.  Denies any nausea, vomiting, dysphagia, loss of appetite or weight loss. Never had colonoscopy for colorectal cancer screening. No family history of colon cancer.   Outpatient Encounter Medications as of 07/19/2017  Medication Sig  . ALPRAZolam (XANAX) 0.5 MG tablet TAKE 1/2 TABLET BY MOUTH TWICE DAILY AS NEEDED FOR ANXIETY  . cholecalciferol (VITAMIN D) 1000 units tablet Take 1,000 Units by mouth daily.  . Multiple Vitamin (MULTIVITAMIN) tablet Take 1 tablet by mouth daily.  . pantoprazole (PROTONIX) 40 MG tablet TAKE 1 TABLET BY MOUTH EVERY DAY  . Turmeric 400 MG CAPS Take by mouth.  . vitamin C  (ASCORBIC ACID) 500 MG tablet Take 500 mg by mouth daily.   No facility-administered encounter medications on file as of 07/19/2017.     Allergies as of 07/19/2017 - Review Complete 07/19/2017  Allergen Reaction Noted  . Latex Rash 11/09/2015    Past Medical History:  Diagnosis Date  . Allergic rhinitis   . Anxiety   . Arthritis    low back and both hands  . Iron deficiency anemia 05/2017    (GI referral ordered 05/2017)  Hemoccults neg 05/26/17  . Melanoma ([place])    Near umbilicus. close f/u with Dr. Delman Cheadle (annual)  . Tobacco dependence     Past Surgical History:  Procedure Laterality Date  . CESAREAN SECTION    . EYE SURGERY    . MELANOMA EXCISION      Family History  Problem Relation Age of Onset  . Arthritis Mother   . Asthma Mother   . COPD Mother   . Hypertension Mother   . Cancer Father        liver ca  . Alcohol abuse Father   . Alzheimer's disease Father   . Cancer Brother        Leukemia 2017  . Hearing loss Brother   . Early death Brother   . Alcohol abuse Maternal Grandmother   . Cancer Maternal Grandmother   . Cancer Maternal Grandfather        lung  . Alzheimer's disease Paternal Grandmother   .  Cancer Paternal Grandfather        lung  . Asthma Daughter     Social History   Socioeconomic History  . Marital status: Married    Spouse name: Not on file  . Number of children: Not on file  . Years of education: Not on file  . Highest education level: Not on file  Occupational History  . Occupation: Teacher, early years/pre  Social Needs  . Financial resource strain: Not on file  . Food insecurity:    Worry: Not on file    Inability: Not on file  . Transportation needs:    Medical: Not on file    Non-medical: Not on file  Tobacco Use  . Smoking status: Current Every Day Smoker    Packs/day: 0.50    Years: 30.00    Pack years: 15.00    Types: Cigarettes  . Smokeless tobacco: Never Used  Substance and Sexual Activity  . Alcohol use: Yes     Alcohol/week: 1.2 oz    Types: 2 Glasses of wine per week    Comment: white wine every evening  . Drug use: Never  . Sexual activity: Yes    Partners: Male  Lifestyle  . Physical activity:    Days per week: Not on file    Minutes per session: Not on file  . Stress: Not on file  Relationships  . Social connections:    Talks on phone: Not on file    Gets together: Not on file    Attends religious service: Not on file    Active member of club or organization: Not on file    Attends meetings of clubs or organizations: Not on file    Relationship status: Not on file  . Intimate partner violence:    Fear of current or ex partner: Not on file    Emotionally abused: Not on file    Physically abused: Not on file    Forced sexual activity: Not on file  Other Topics Concern  . Not on file  Social History Narrative   Married (her pt is a husband of mine as well), 3 children, 1 grand child.   Educ: college.   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   Tob: current as of 05/2017; 15 pack-yr hx.   AJG:[id number]      Review of systems: Review of Systems  Constitutional: Negative for fever and chills.  HENT: Negative.   Eyes: Negative for blurred vision.  Respiratory: Negative for cough, shortness of breath and wheezing.   Cardiovascular: Negative for chest pain and palpitations.  Gastrointestinal: as per HPI Genitourinary: Negative for dysuria, urgency, frequency and hematuria.  Musculoskeletal: Positive for myalgias, back pain and joint pain.  Skin: Negative for itching and rash.  Neurological: Negative for dizziness, tremors, focal weakness, seizures and loss of consciousness.  Endo/Heme/Allergies: Positive for seasonal allergies.  Psychiatric/Behavioral: Negative for depression, suicidal ideas and hallucinations.  Positive for anxiety All other systems reviewed and are negative.   Physical Exam: Vitals:   07/19/17 1100  BP: 124/82  Pulse: 90   Body mass index is 20.14  kg/m. Gen:      No acute distress HEENT:  EOMI, sclera anicteric Neck:     No masses; no thyromegaly Lungs:    Clear to auscultation bilaterally; normal respiratory effort CV:         Regular rate and rhythm; no murmurs Abd:      + bowel sounds; soft, non-tender; no palpable masses,  no distension Ext:    No edema; adequate peripheral perfusion Skin:      Warm and dry; no rash Neuro: alert and oriented x 3 Psych: normal mood and affect  Data Reviewed:  Reviewed labs, radiology imaging, old records and pertinent past GI work up   Assessment and Plan/Recommendations:  67 year old female with recent onset iron deficiency anemia Epigastric abdominal pain and dyspepsia symptoms have improved with Protonix 40 mg daily Continue PPI and antireflux measures Never had colorectal cancer screening We will schedule for EGD and colonoscopy for further evaluation of iron deficiency anemia and exclude GI blood loss Continue oral iron replacement We will recheck CBC and iron panel with ferritin next month, if continues to have significant iron deficiency with no improvement despite oral replacement for 3 months , will need to consider IV iron infusion. The risks and benefits as well as alternatives of endoscopic procedure(s) have been discussed and reviewed. All questions answered. The patient agrees to proceed. Return after the procedure as needed  K. Denzil Magnuson , MD [phone]    CC: McGowen, Adrian Blackwater, MD

## 2017-07-27 ENCOUNTER — Other Ambulatory Visit (INDEPENDENT_AMBULATORY_CARE_PROVIDER_SITE_OTHER): Payer: BC Managed Care – PPO

## 2017-07-27 DIAGNOSIS — D508 Other iron deficiency anemias: Secondary | ICD-10-CM | POA: Diagnosis not present

## 2017-07-27 LAB — CBC WITH DIFFERENTIAL/PLATELET
BASOS PCT: 0.6 % (ref 0.0–3.0)
Basophils Absolute: 0 10*3/uL (ref 0.0–0.1)
EOS ABS: 0.1 10*3/uL (ref 0.0–0.7)
Eosinophils Relative: 2 % (ref 0.0–5.0)
HCT: 41.9 % (ref 36.0–46.0)
Hemoglobin: 13.8 g/dL (ref 12.0–15.0)
Lymphocytes Relative: 32.2 % (ref 12.0–46.0)
Lymphs Abs: 2.2 10*3/uL (ref 0.7–4.0)
MCHC: 33 g/dL (ref 30.0–36.0)
MCV: 85 fl (ref 78.0–100.0)
MONO ABS: 0.6 10*3/uL (ref 0.1–1.0)
Monocytes Relative: 8.2 % (ref 3.0–12.0)
NEUTROS ABS: 3.9 10*3/uL (ref 1.4–7.7)
Neutrophils Relative %: 57 % (ref 43.0–77.0)
PLATELETS: 226 10*3/uL (ref 150.0–400.0)
RBC: 4.93 Mil/uL (ref 3.87–5.11)
RDW: 23.2 % — AB (ref 11.5–15.5)
WBC: 6.9 10*3/uL (ref 4.0–10.5)

## 2017-07-27 LAB — IBC PANEL
Iron: 180 ug/dL — ABNORMAL HIGH (ref 42–145)
SATURATION RATIOS: 42.3 % (ref 20.0–50.0)
Transferrin: 304 mg/dL (ref 212.0–360.0)

## 2017-07-27 LAB — FERRITIN: FERRITIN: 23.8 ng/mL (ref 10.0–291.0)

## 2017-08-07 ENCOUNTER — Encounter: Payer: Self-pay | Admitting: Gastroenterology

## 2017-08-14 ENCOUNTER — Encounter: Payer: Self-pay | Admitting: Family Medicine

## 2017-08-17 ENCOUNTER — Ambulatory Visit (AMBULATORY_SURGERY_CENTER): Payer: BC Managed Care – PPO | Admitting: Gastroenterology

## 2017-08-17 ENCOUNTER — Encounter: Payer: Self-pay | Admitting: Gastroenterology

## 2017-08-17 ENCOUNTER — Other Ambulatory Visit: Payer: Self-pay

## 2017-08-17 VITALS — BP 128/67 | HR 74 | Temp 96.8°F | Resp 17 | Ht 63.5 in | Wt 115.0 lb

## 2017-08-17 DIAGNOSIS — R1013 Epigastric pain: Secondary | ICD-10-CM | POA: Diagnosis not present

## 2017-08-17 DIAGNOSIS — D509 Iron deficiency anemia, unspecified: Secondary | ICD-10-CM

## 2017-08-17 DIAGNOSIS — K2970 Gastritis, unspecified, without bleeding: Secondary | ICD-10-CM

## 2017-08-17 HISTORY — PX: COLONOSCOPY: SHX174

## 2017-08-17 HISTORY — DX: Gastritis, unspecified, without bleeding: K29.70

## 2017-08-17 HISTORY — PX: ESOPHAGOGASTRODUODENOSCOPY: SHX1529

## 2017-08-17 MED ORDER — SODIUM CHLORIDE 0.9 % IV SOLN: 500.0000 mL | Freq: Once | INTRAVENOUS | Status: DC

## 2017-08-17 NOTE — Op Note (Addendum)
[facility] Patient Name: Linda Davies Procedure Date: 08/17/2017 2:27 PM MRN: [id number] Endoscopist: Mauri Pole , MD Age: 67 Referring MD:  Date of Birth: 11/28/1950 Gender: Female Account #: [id number] Procedure:                Colonoscopy Indications:              Unexplained iron deficiency anemia Medicines:                Monitored Anesthesia Care Procedure:                Pre-Anesthesia Assessment:                           - Prior to the procedure, a History and Physical                            was performed, and patient medications and                            allergies were reviewed. The patient's tolerance of                            previous anesthesia was also reviewed. The risks                            and benefits of the procedure and the sedation                            options and risks were discussed with the patient.                            All questions were answered, and informed consent                            was obtained. Prior Anticoagulants: The patient has                            taken no previous anticoagulant or antiplatelet                            agents. ASA Grade Assessment: II - A patient with                            mild systemic disease. After reviewing the risks                            and benefits, the patient was deemed in                            satisfactory condition to undergo the procedure.                           After obtaining informed consent, the colonoscope  was passed under direct vision. Throughout the                            procedure, the patient's blood pressure, pulse, and                            oxygen saturations were monitored continuously. The                            Colonoscope was introduced through the anus and                            advanced to the the cecum, identified by                            appendiceal orifice and ileocecal  valve. The                            colonoscopy was performed without difficulty. The                            patient tolerated the procedure well. The quality                            of the bowel preparation was excellent. The                            ileocecal valve, appendiceal orifice, and rectum                            were photographed. Scope In: 3:00:46 PM Scope Out: 3:22:04 PM Scope Withdrawal Time: 0 hours 13 minutes 17 seconds  Total Procedure Duration: 0 hours 21 minutes 18 seconds  Findings:                 The perianal and digital rectal examinations were                            normal.                           Scattered small and large-mouthed diverticula were                            found in the sigmoid colon, descending colon,                            transverse colon and ascending colon.                           Non-bleeding internal hemorrhoids were found during                            retroflexion. The hemorrhoids were small. Complications:            No immediate complications. Estimated Blood Loss:  Estimated blood loss: none. Impression:               - Diverticulosis in the sigmoid colon, in the                            descending colon, in the transverse colon and in                            the ascending colon.                           - Non-bleeding internal hemorrhoids.                           - No specimens collected. Recommendation:           - Patient has a contact number available for                            emergencies. The signs and symptoms of potential                            delayed complications were discussed with the                            patient. Return to normal activities tomorrow.                            Written discharge instructions were provided to the                            patient.                           - Resume previous diet.                           - Continue present  medications.                           - Repeat colonoscopy in 10 years for screening                            purposes.                           -Return to GI office in 2-3 months, will recheck                            CBC and iron panel. Also consider small bowel vidoe                            capsule to further evaluate. Mauri Pole, MD 08/17/2017 3:31:12 PM This report has been signed electronically.

## 2017-08-17 NOTE — Progress Notes (Signed)
Called to room to assist during endoscopic procedure.  Patient ID and intended procedure confirmed with present staff. Received instructions for my participation in the procedure from the performing physician.  

## 2017-08-17 NOTE — Op Note (Signed)
[facility] Patient Name: Linda Davies Procedure Date: 08/17/2017 2:27 PM MRN: [id number] Endoscopist: Mauri Pole , MD Age: 67 Referring MD:  Date of Birth: 02/24/1950 Gender: Female Account #: [id number] Procedure:                Upper GI endoscopy Indications:              Suspected upper gastrointestinal bleeding in                            patient with unexplained iron deficiency anemia Medicines:                Monitored Anesthesia Care Procedure:                Pre-Anesthesia Assessment:                           - Prior to the procedure, a History and Physical                            was performed, and patient medications and                            allergies were reviewed. The patient's tolerance of                            previous anesthesia was also reviewed. The risks                            and benefits of the procedure and the sedation                            options and risks were discussed with the patient.                            All questions were answered, and informed consent                            was obtained. Prior Anticoagulants: The patient has                            taken no previous anticoagulant or antiplatelet                            agents. ASA Grade Assessment: II - A patient with                            mild systemic disease. After reviewing the risks                            and benefits, the patient was deemed in                            satisfactory condition to undergo the procedure.  After obtaining informed consent, the endoscope was                            passed under direct vision. Throughout the                            procedure, the patient's blood pressure, pulse, and                            oxygen saturations were monitored continuously. The                            Model GIF-HQ190 [id number]) scope was introduced                            through  the mouth, and advanced to the second part                            of duodenum. The upper GI endoscopy was                            accomplished without difficulty. The patient                            tolerated the procedure well. Scope In: Scope Out: Findings:                 The esophagus was normal.                           Patchy mild inflammation characterized by                            congestion (edema), erythema and linear erosions                            was found in the entire examined stomach. Biopsies                            were taken with a cold forceps for Helicobacter                            pylori testing.                           The first portion of the duodenum and second                            portion of the duodenum were normal. Biopsies for                            histology were taken with a cold forceps for                            evaluation of celiac disease. Complications:  No immediate complications. Estimated Blood Loss:     Estimated blood loss was minimal. Impression:               - Normal esophagus.                           - Gastritis. Biopsied.                           - Normal first portion of the duodenum and second                            portion of the duodenum. Biopsied. Recommendation:           - Patient has a contact number available for                            emergencies. The signs and symptoms of potential                            delayed complications were discussed with the                            patient. Return to normal activities tomorrow.                            Written discharge instructions were provided to the                            patient.                           - Resume previous diet.                           - Continue present medications.                           - Await pathology results.                           - No high dose aspirin, ibuprofen, naproxen,  or                            other non-steroidal anti-inflammatory drugs.                           - Continue Protonix (pantoprazole) 40 mg PO daily. Mauri Pole, MD 08/17/2017 3:27:36 PM This report has been signed electronically.

## 2017-08-17 NOTE — Progress Notes (Signed)
Report given to PACU, vss 

## 2017-08-17 NOTE — Patient Instructions (Signed)
** Handouts given on diverticulosis and hemorrhoids ** AVOIDS NSAIDS AND HIGH DOSE ASPIRIN! Continue Protonix as prescribed.   YOU HAD AN ENDOSCOPIC PROCEDURE TODAY AT [facility]:   Refer to the procedure report that was given to you for any specific questions about what was found during the examination.  If the procedure report does not answer your questions, please call your gastroenterologist to clarify.  If you requested that your care partner not be given the details of your procedure findings, then the procedure report has been included in a sealed envelope for you to review at your convenience later.  YOU SHOULD EXPECT: Some feelings of bloating in the abdomen. Passage of more gas than usual.  Walking can help get rid of the air that was put into your GI tract during the procedure and reduce the bloating. If you had a lower endoscopy (such as a colonoscopy or flexible sigmoidoscopy) you may notice spotting of blood in your stool or on the toilet paper. If you underwent a bowel prep for your procedure, you may not have a normal bowel movement for a few days.  Please Note:  You might notice some irritation and congestion in your nose or some drainage.  This is from the oxygen used during your procedure.  There is no need for concern and it should clear up in a day or so.  SYMPTOMS TO REPORT IMMEDIATELY:   Following lower endoscopy (colonoscopy or flexible sigmoidoscopy):  Excessive amounts of blood in the stool  Significant tenderness or worsening of abdominal pains  Swelling of the abdomen that is new, acute  Fever of 100F or higher   Following upper endoscopy (EGD)  Vomiting of blood or coffee ground material  New chest pain or pain under the shoulder blades  Painful or persistently difficult swallowing  New shortness of breath  Fever of 100F or higher  Black, tarry-looking stools  For urgent or emergent issues, a gastroenterologist can be reached at any hour by  calling [phone].   DIET:  We do recommend a small meal at first, but then you may proceed to your regular diet.  Drink plenty of fluids but you should avoid alcoholic beverages for 24 hours.  ACTIVITY:  You should plan to take it easy for the rest of today and you should NOT DRIVE or use heavy machinery until tomorrow (because of the sedation medicines used during the test).    FOLLOW UP: Our staff will call the number listed on your records the next business day following your procedure to check on you and address any questions or concerns that you may have regarding the information given to you following your procedure. If we do not reach you, we will leave a message.  However, if you are feeling well and you are not experiencing any problems, there is no need to return our call.  We will assume that you have returned to your regular daily activities without incident.  If any biopsies were taken you will be contacted by phone or by letter within the next 1-3 weeks.  Please call us at [phone] if you have not heard about the biopsies in 3 weeks.    SIGNATURES/CONFIDENTIALITY: You and/or your care partner have signed paperwork which will be entered into your electronic medical record.  These signatures attest to the fact that that the information above on your After Visit Summary has been reviewed and is understood.  Full responsibility of the confidentiality of this  discharge information lies with you and/or your care-partner. 

## 2017-08-21 ENCOUNTER — Telehealth: Payer: Self-pay

## 2017-08-21 NOTE — Telephone Encounter (Signed)
  Follow up Call-  Call back number 08/17/2017  Post procedure Call Back phone  # [phone]  Permission to leave phone message Yes  Some recent data might be hidden     Patient questions:  Do you have a fever, pain , or abdominal swelling? No. Pain Score  0 *  Have you tolerated food without any problems? Yes.    Have you been able to return to your normal activities? Yes.    Do you have any questions about your discharge instructions: Diet   No. Medications  No. Follow up visit  No.  Do you have questions or concerns about your Care? No.  Actions: * If pain score is 4 or above: No action needed, pain <4.

## 2017-08-21 NOTE — Telephone Encounter (Signed)
firat post procedure phone call, no answer

## 2017-08-23 ENCOUNTER — Encounter: Payer: Self-pay | Admitting: Family Medicine

## 2017-08-24 ENCOUNTER — Encounter: Payer: Self-pay | Admitting: Gastroenterology

## 2017-10-13 ENCOUNTER — Ambulatory Visit: Payer: BC Managed Care – PPO | Admitting: Gastroenterology

## 2017-10-30 ENCOUNTER — Ambulatory Visit (INDEPENDENT_AMBULATORY_CARE_PROVIDER_SITE_OTHER): Payer: BC Managed Care – PPO | Admitting: *Deleted

## 2017-10-30 ENCOUNTER — Ambulatory Visit: Payer: BC Managed Care – PPO

## 2017-10-30 DIAGNOSIS — Z23 Encounter for immunization: Secondary | ICD-10-CM

## 2017-10-30 NOTE — Progress Notes (Signed)
Patient presents today for shingrix vaccine # 2. Patient tolerated injection well.

## 2017-12-02 ENCOUNTER — Other Ambulatory Visit: Payer: Self-pay | Admitting: Family Medicine

## 2017-12-31 ENCOUNTER — Other Ambulatory Visit: Payer: Self-pay | Admitting: Family Medicine

## 2018-01-01 NOTE — Telephone Encounter (Signed)
Will do alpraz #30 but she needs o/v for f/u anxiety before any FURTHER RF's.-thx

## 2018-01-01 NOTE — Telephone Encounter (Signed)
RF request for alprazolam LOV: 05/24/17 Next ov: None Last written: 07/10/17 #30 w/ 5RF  Please advise. Thanks.

## 2018-01-05 NOTE — Telephone Encounter (Signed)
Pt advised and voiced understanding.    Apt made for 01/29/18 at 1:45pm.

## 2018-01-29 ENCOUNTER — Ambulatory Visit: Payer: Medicare Other | Admitting: Family Medicine

## 2018-01-29 ENCOUNTER — Encounter: Payer: Self-pay | Admitting: Family Medicine

## 2018-01-29 VITALS — BP 124/82 | HR 93 | Temp 98.8°F | Resp 16 | Ht 63.5 in | Wt 125.0 lb

## 2018-01-29 DIAGNOSIS — K2950 Unspecified chronic gastritis without bleeding: Secondary | ICD-10-CM

## 2018-01-29 DIAGNOSIS — F418 Other specified anxiety disorders: Secondary | ICD-10-CM | POA: Diagnosis not present

## 2018-01-29 DIAGNOSIS — D509 Iron deficiency anemia, unspecified: Secondary | ICD-10-CM

## 2018-01-29 DIAGNOSIS — F17200 Nicotine dependence, unspecified, uncomplicated: Secondary | ICD-10-CM

## 2018-01-29 DIAGNOSIS — Z23 Encounter for immunization: Secondary | ICD-10-CM

## 2018-01-29 LAB — CBC
HCT: 45 % (ref 36.0–46.0)
Hemoglobin: 15.1 g/dL — ABNORMAL HIGH (ref 12.0–15.0)
MCHC: 33.6 g/dL (ref 30.0–36.0)
MCV: 94.4 fl (ref 78.0–100.0)
PLATELETS: 249 10*3/uL (ref 150.0–400.0)
RBC: 4.76 Mil/uL (ref 3.87–5.11)
RDW: 12.4 % (ref 11.5–15.5)
WBC: 8.3 10*3/uL (ref 4.0–10.5)

## 2018-01-29 MED ORDER — ALPRAZOLAM 0.5 MG PO TABS: ORAL_TABLET | ORAL | 5 refills | Status: DC

## 2018-01-29 NOTE — Progress Notes (Signed)
OFFICE VISIT  01/29/2018   CC:  Chief Complaint  Patient presents with  . Follow-up    RCI, pt is not fasting.     HPI:    Patient is a 67 y.o. Caucasian female who presents for f/u anxiety, chronic gastritis with iron def anemia, and tobacco dependence. After last iron level check with GI about 6 mo ago her levels were improving and she was instructed to continue iron replacement.  She is eating an iron rich diet now AND taking ferrous sulfate 3 tabs per day.  No side effects.  Anxiety, mostly situational, esp surrounding MD visits and fear of medical problems:   Most recent alpraz rx fill was 01/02/18.  She takes 1/2 tab daily.  Took this med last night and the night before as well. Her gyn put her on wellbutrin recently but she could not tolerate it due to insomnia.    Gastritis: taking pantoprazole every day still.  Still has some mild abd burning and GERD if she eats spicy foods.  She still smokes cigs, not contemplating quitting at this time.  ROS: no CP, no SOB, no wheezing, no cough, no dizziness, no HAs, no rashes, no melena/hematochezia.  No polyuria or polydipsia.  No myalgias or arthralgias.   Past Medical History:  Diagnosis Date  . Allergic rhinitis   . Anxiety   . Arthritis    low back and both hands  . Gastritis 08/17/2017   EGD  . Iron deficiency anemia 05/2017     Hemoccults neg 05/26/17.  Colonoscopy -->no culprit.  EGD--> gastritis (h pyl NEG).  . Melanoma ([place])    Near umbilicus. close f/u with Dr. Delman Cheadle (annual)  . Tobacco dependence     Past Surgical History:  Procedure Laterality Date  . CESAREAN SECTION    . COLONOSCOPY  08/17/2017   Done for unexplained IDA-->Diverticulosis, non-bleeding internal hem, o/w normal.  Repeat 10 yrs.  . ESOPHAGOGASTRODUODENOSCOPY  08/17/2017   +Gastritis.  Gastric bx "reactive gastritis".  Bx for H pylori NEG.        Duodenum bx: NORMAL.  Marland Kitchen EYE SURGERY    . MELANOMA EXCISION      Outpatient Medications Prior to  Visit  Medication Sig Dispense Refill  . cholecalciferol (VITAMIN D) 1000 units tablet Take 1,000 Units by mouth daily.    . ferrous sulfate 325 (65 FE) MG EC tablet Take 325 mg by mouth 3 (three) times daily with meals.    . Multiple Vitamin (MULTIVITAMIN) tablet Take 1 tablet by mouth daily.    . pantoprazole (PROTONIX) 40 MG tablet TAKE 1 TABLET BY MOUTH EVERY DAY. 30 tablet 5  . Turmeric 400 MG CAPS Take by mouth.    . vitamin C (ASCORBIC ACID) 500 MG tablet Take 500 mg by mouth daily.    Marland Kitchen ALPRAZolam (XANAX) 0.5 MG tablet TAKE 1/2 TABLET BY MOUTH TWICE DAILY AS NEEDED FOR ANXIETY. 30 tablet 0  . 0.9 %  sodium chloride infusion      No facility-administered medications prior to visit.     Allergies  Allergen Reactions  . Latex Rash    ROS As per HPI  PE: Blood pressure 124/82, pulse 93, temperature 98.8 F (37.1 C), temperature source Oral, resp. rate 16, height 5' 3.5" (1.613 m), weight 125 lb (56.7 kg), SpO2 93 %. Gen: Alert, well appearing.  Patient is oriented to person, place, time, and situation. AFFECT: pleasant, lucid thought and speech. No further exam today.  LABS:  Lab Results  Component Value Date   WBC 8.3 01/29/2018   HGB 15.1 (H) 01/29/2018   HCT 45.0 01/29/2018   MCV 94.4 01/29/2018   PLT 249.0 01/29/2018   Lab Results  Component Value Date   IRON 180 (H) 07/27/2017   FERRITIN 23.8 07/27/2017      Chemistry      Component Value Date/Time   NA 139 05/10/2017 1344   K 4.4 05/10/2017 1344   CL 103 05/10/2017 1344   CO2 31 05/10/2017 1344   BUN 10 05/10/2017 1344   CREATININE 0.92 05/10/2017 1344      Component Value Date/Time   CALCIUM 9.4 05/10/2017 1344   ALKPHOS 60 05/10/2017 1344   AST 27 05/10/2017 1344   ALT 19 05/10/2017 1344   BILITOT 0.3 05/10/2017 1344     Lab Results  Component Value Date   CHOL 163 05/24/2017   HDL 83.30 05/24/2017   LDLCALC 62 05/24/2017   TRIG 87.0 05/24/2017   CHOLHDL 2 05/24/2017   Lab Results   Component Value Date   TSH 1.49 05/24/2017    IMPRESSION AND PLAN:  1) Chronic gastritis: Doing well, continue pantoprazole.  2) Iron def anemia; not sure if partially due to chronic gastritis or iron-poor diet (or both). Per GI recommendations, she is on high iron diet and takes 325mg  ferrous sulfate tid. Iron levels were improving 6 mo ago.  Will recheck CBC, ferritin, iron panel today, and possibly we can get her off her iron pills.  3) Situational anxiety: doing fine with prn use of alprazolam. CSC is UTD.  Will do UDS next f/u visit. Alprazolam 0.5mg , 1/2-1 tab bid prn, #45, RF x 5. She has some depression as well, says her GYN is following her for this.  She recently could not tolerate a trial of wellbutrin.  Pt was clear today that she was getting this problem followed with her GYN and did not want to address it today with me.  4) Preventative health care: she just turned 67 y/o today-->prevnar 13 given today.   Her flu vaccine is UTD.  5) Tobacco dependence: she is not ready to attempt to quit at this time. Encouraged smoking cessation.  An After Visit Summary was printed and given to the patient.  FOLLOW UP: No follow-ups on file.6 mo  Signed:  [name], MD           01/29/2018

## 2018-01-29 NOTE — Progress Notes (Signed)
OFFICE VISIT  01/29/2018   CC:  Chief Complaint  Patient presents with  . Follow-up    RCI, pt is not fasting.     HPI:    Patient is a 67 y.o. Caucasian female who presents for f/u anxiety, chronic gastritis with iron def anemia, and tobacco dependence. After last iron level check with GI about 6 mo ago her levels were improving and she was instructed to continue iron replacement.  Anxiety, mostly situational, esp surrounding MD visits and fear of medical problems:     Past Medical History:  Diagnosis Date  . Allergic rhinitis   . Anxiety   . Arthritis    low back and both hands  . Gastritis 08/17/2017   EGD  . Iron deficiency anemia 05/2017     Hemoccults neg 05/26/17.  Colonoscopy -->no culprit.  EGD--> gastritis (h pyl NEG).  . Melanoma ([place])    Near umbilicus. close f/u with Dr. Delman Cheadle (annual)  . Tobacco dependence     Past Surgical History:  Procedure Laterality Date  . CESAREAN SECTION    . COLONOSCOPY  08/17/2017   Done for unexplained IDA-->Diverticulosis, non-bleeding internal hem, o/w normal.  Repeat 10 yrs.  . ESOPHAGOGASTRODUODENOSCOPY  08/17/2017   +Gastritis.  Gastric bx "reactive gastritis".  Bx for H pylori NEG.        Duodenum bx: NORMAL.  Marland Kitchen EYE SURGERY    . MELANOMA EXCISION      Outpatient Medications Prior to Visit  Medication Sig Dispense Refill  . cholecalciferol (VITAMIN D) 1000 units tablet Take 1,000 Units by mouth daily.    . ferrous sulfate 325 (65 FE) MG EC tablet Take 325 mg by mouth 3 (three) times daily with meals.    . Multiple Vitamin (MULTIVITAMIN) tablet Take 1 tablet by mouth daily.    . pantoprazole (PROTONIX) 40 MG tablet TAKE 1 TABLET BY MOUTH EVERY DAY. 30 tablet 5  . Turmeric 400 MG CAPS Take by mouth.    . vitamin C (ASCORBIC ACID) 500 MG tablet Take 500 mg by mouth daily.    Marland Kitchen ALPRAZolam (XANAX) 0.5 MG tablet TAKE 1/2 TABLET BY MOUTH TWICE DAILY AS NEEDED FOR ANXIETY. 30 tablet 0  . 0.9 %  sodium chloride infusion       No facility-administered medications prior to visit.     Allergies  Allergen Reactions  . Latex Rash    ROS As per HPI  PE: Blood pressure 124/82, pulse 93, temperature 98.8 F (37.1 C), temperature source Oral, resp. rate 16, height 5' 3.5" (1.613 m), weight 125 lb (56.7 kg), SpO2 93 %. Gen: Alert, well appearing.  Patient is oriented to person, place, time, and situation. AFFECT: pleasant, lucid thought and speech. No further exam today.  LABS:  Lab Results  Component Value Date   WBC 8.3 01/29/2018   HGB 15.1 (H) 01/29/2018   HCT 45.0 01/29/2018   MCV 94.4 01/29/2018   PLT 249.0 01/29/2018   Lab Results  Component Value Date   IRON 180 (H) 07/27/2017   FERRITIN 23.8 07/27/2017      Chemistry      Component Value Date/Time   NA 139 05/10/2017 1344   K 4.4 05/10/2017 1344   CL 103 05/10/2017 1344   CO2 31 05/10/2017 1344   BUN 10 05/10/2017 1344   CREATININE 0.92 05/10/2017 1344      Component Value Date/Time   CALCIUM 9.4 05/10/2017 1344   ALKPHOS 60 05/10/2017 1344   AST  27 05/10/2017 1344   ALT 19 05/10/2017 1344   BILITOT 0.3 05/10/2017 1344     Lab Results  Component Value Date   CHOL 163 05/24/2017   HDL 83.30 05/24/2017   LDLCALC 62 05/24/2017   TRIG 87.0 05/24/2017   CHOLHDL 2 05/24/2017   Lab Results  Component Value Date   TSH 1.49 05/24/2017    IMPRESSION AND PLAN:  No problem-specific Assessment & Plan notes found for this encounter.  UDS?  An After Visit Summary was printed and given to the patient.  FOLLOW UP: Return in about 6 months (around 07/31/2018) for annual CPE (fasting).  Signed:  [name], MD           01/29/2018

## 2018-01-30 LAB — IRON: Iron: 137 ug/dL (ref 42–145)

## 2018-01-30 LAB — IRON AND TIBC
IRON: 125 ug/dL (ref 27–139)
Iron Saturation: 39 % (ref 15–55)
Total Iron Binding Capacity: 320 ug/dL (ref 250–450)
UIBC: 195 ug/dL (ref 118–369)

## 2018-01-30 LAB — FERRITIN: Ferritin: 57 ng/mL (ref 10.0–291.0)

## 2018-07-02 ENCOUNTER — Other Ambulatory Visit: Payer: Self-pay

## 2018-07-02 MED ORDER — PANTOPRAZOLE SODIUM 40 MG PO TBEC: 40.0000 mg | DELAYED_RELEASE_TABLET | Freq: Every day | ORAL | 5 refills | Status: DC

## 2018-07-31 ENCOUNTER — Other Ambulatory Visit: Payer: Self-pay

## 2018-07-31 MED ORDER — ALPRAZOLAM 0.5 MG PO TABS: ORAL_TABLET | ORAL | 0 refills | Status: DC

## 2018-07-31 NOTE — Telephone Encounter (Signed)
I'll do 1 mo supply with no RFs. Needs o/v or virtual visit to f/u anxiety OR o/v for CPE---her choice---sometime in the next 1 month. CSC needs renewal at that time.-thx

## 2018-07-31 NOTE — Telephone Encounter (Signed)
RF request for Alprazolam LOV: 01/29/18 Next ov: advised to f/u 6 months for CPE Last written: 01/29/18 (45,5) Last CSC:05/23/17 w/ no UDS  PMP aware printed and medication pending. Given to provider, Please advise, thanks.

## 2018-08-14 NOTE — Telephone Encounter (Signed)
LM for pt to call and schedule follow up or physical. Please schedule pt if calls back.

## 2018-09-18 ENCOUNTER — Telehealth: Payer: Self-pay

## 2018-09-18 NOTE — Telephone Encounter (Addendum)
RF request for Alprazolam LOV: 01/29/18 f/u RCI Next ov: 10/23/18 CPE Last written: 07/31/18 (15,0) Last CSC: 05/23/17 w/ no UDS   Please advise if refill can be sent until appt next month, thanks.

## 2018-09-20 MED ORDER — ALPRAZOLAM 0.5 MG PO TABS: ORAL_TABLET | ORAL | 1 refills | Status: DC

## 2018-09-20 NOTE — Addendum Note (Signed)
Addended by: Tammi Sou on: 09/20/2018 04:57 PM   Modules accepted: Orders

## 2018-09-21 NOTE — Telephone Encounter (Signed)
Pt was notified enough medication sent to last until appt next month.

## 2018-10-23 ENCOUNTER — Other Ambulatory Visit: Payer: Self-pay

## 2018-10-23 ENCOUNTER — Ambulatory Visit: Payer: Medicare Other | Admitting: Family Medicine

## 2018-10-23 ENCOUNTER — Encounter: Payer: Self-pay | Admitting: Family Medicine

## 2018-10-23 VITALS — BP 123/76 | HR 76 | Temp 98.4°F | Resp 16 | Ht 63.0 in | Wt 123.8 lb

## 2018-10-23 DIAGNOSIS — Z79899 Other long term (current) drug therapy: Secondary | ICD-10-CM | POA: Diagnosis not present

## 2018-10-23 DIAGNOSIS — F418 Other specified anxiety disorders: Secondary | ICD-10-CM | POA: Diagnosis not present

## 2018-10-23 DIAGNOSIS — Z0000 Encounter for general adult medical examination without abnormal findings: Secondary | ICD-10-CM

## 2018-10-23 DIAGNOSIS — Z23 Encounter for immunization: Secondary | ICD-10-CM | POA: Diagnosis not present

## 2018-10-23 LAB — COMPREHENSIVE METABOLIC PANEL
ALT: 18 U/L (ref 0–35)
AST: 19 U/L (ref 0–37)
Albumin: 4.7 g/dL (ref 3.5–5.2)
Alkaline Phosphatase: 63 U/L (ref 39–117)
BUN: 14 mg/dL (ref 6–23)
CO2: 32 mEq/L (ref 19–32)
Calcium: 10.2 mg/dL (ref 8.4–10.5)
Chloride: 103 mEq/L (ref 96–112)
Creatinine, Ser: 0.69 mg/dL (ref 0.40–1.20)
GFR: 85.19 mL/min (ref >60.00)
Glucose, Bld: 89 mg/dL (ref 70–99)
Potassium: 4.8 mEq/L (ref 3.5–5.1)
Sodium: 141 mEq/L (ref 135–145)
Total Bilirubin: 0.6 mg/dL (ref 0.2–1.2)
Total Protein: 6.7 g/dL (ref 6.0–8.3)

## 2018-10-23 LAB — LIPID PANEL
Cholesterol: 177 mg/dL (ref 0–200)
HDL: 79.9 mg/dL (ref >39.00)
LDL Cholesterol: 81 mg/dL (ref 0–99)
NonHDL: 96.63
Total CHOL/HDL Ratio: 2
Triglycerides: 76 mg/dL (ref 0.0–149.0)
VLDL: 15.2 mg/dL (ref 0.0–40.0)

## 2018-10-23 LAB — CBC WITH DIFFERENTIAL/PLATELET
Basophils Absolute: 0 10*3/uL (ref 0.0–0.1)
Basophils Relative: 0.6 % (ref 0.0–3.0)
Eosinophils Absolute: 0.1 10*3/uL (ref 0.0–0.7)
Eosinophils Relative: 1.8 % (ref 0.0–5.0)
HCT: 44.8 % (ref 36.0–46.0)
Hemoglobin: 15 g/dL (ref 12.0–15.0)
Lymphocytes Relative: 36.9 % (ref 12.0–46.0)
Lymphs Abs: 2.7 10*3/uL (ref 0.7–4.0)
MCHC: 33.5 g/dL (ref 30.0–36.0)
MCV: 93.5 fl (ref 78.0–100.0)
Monocytes Absolute: 0.5 10*3/uL (ref 0.1–1.0)
Monocytes Relative: 7.1 % (ref 3.0–12.0)
Neutro Abs: 3.9 10*3/uL (ref 1.4–7.7)
Neutrophils Relative %: 53.6 % (ref 43.0–77.0)
Platelets: 249 10*3/uL (ref 150.0–400.0)
RBC: 4.79 Mil/uL (ref 3.87–5.11)
RDW: 12.9 % (ref 11.5–15.5)
WBC: 7.4 10*3/uL (ref 4.0–10.5)

## 2018-10-23 LAB — TSH: TSH: 1.56 u[IU]/mL (ref 0.35–4.50)

## 2018-10-23 MED ORDER — ALPRAZOLAM 0.5 MG PO TABS: ORAL_TABLET | ORAL | 1 refills | Status: DC

## 2018-10-23 NOTE — Progress Notes (Signed)
Office Note 10/23/2018  CC:  Chief Complaint  Patient presents with  . Annual Exam    pt is fasting   HPI:  Linda Davies is a 68 y.o. female who is here for annual health maintenance exam.  Takes xanax nightly to help with sleep and sometimes for extra anxiety during the day. Most recent dose was last night.  Exercise: cleans house all day, some yoga, walking regularly, stretching. Diet: fair-to-good. Still smoking 1/2 pack per day.  Past Medical History:  Diagnosis Date  . Allergic rhinitis   . Anxiety   . Arthritis    low back and both hands  . Gastritis 08/17/2017   EGD  . Iron deficiency anemia 05/2017     Hemoccults neg 05/26/17.  Colonoscopy -->no culprit.  EGD--> gastritis (h pyl NEG).  . Melanoma ([place])    Near umbilicus. close f/u with Dr. Delman Cheadle (annual)  . Tobacco dependence     Past Surgical History:  Procedure Laterality Date  . CESAREAN SECTION    . COLONOSCOPY  08/17/2017   Done for unexplained IDA-->Diverticulosis, non-bleeding internal hem, o/w normal.  Repeat 10 yrs.  . ESOPHAGOGASTRODUODENOSCOPY  08/17/2017   +Gastritis.  Gastric bx "reactive gastritis".  Bx for H pylori NEG.        Duodenum bx: NORMAL.  Marland Kitchen EYE SURGERY    . MELANOMA EXCISION      Family History  Problem Relation Age of Onset  . Arthritis Mother   . Asthma Mother   . COPD Mother   . Hypertension Mother   . Cancer Father        liver ca  . Alcohol abuse Father   . Alzheimer's disease Father   . Cancer Brother        Leukemia 2017  . Hearing loss Brother   . Early death Brother   . Alcohol abuse Maternal Grandmother   . Cancer Maternal Grandmother   . Cancer Maternal Grandfather        lung  . Alzheimer's disease Paternal Grandmother   . Cancer Paternal Grandfather        lung  . Asthma Daughter     Social History   Socioeconomic History  . Marital status: Married    Spouse name: Not on file  . Number of children: Not on file  . Years of education: Not on file   . Highest education level: Not on file  Occupational History  . Occupation: Teacher, early years/pre  Social Needs  . Financial resource strain: Not on file  . Food insecurity    Worry: Not on file    Inability: Not on file  . Transportation needs    Medical: Not on file    Non-medical: Not on file  Tobacco Use  . Smoking status: Current Every Day Smoker    Packs/day: 0.50    Years: 30.00    Pack years: 15.00    Types: Cigarettes  . Smokeless tobacco: Never Used  Substance and Sexual Activity  . Alcohol use: Yes    Alcohol/week: 2.0 standard drinks    Types: 2 Glasses of wine per week    Comment: white wine every evening  . Drug use: Never  . Sexual activity: Yes    Partners: Male  Lifestyle  . Physical activity    Days per week: Not on file    Minutes per session: Not on file  . Stress: Not on file  Relationships  . Social Herbalist on phone:  Not on file    Gets together: Not on file    Attends religious service: Not on file    Active member of club or organization: Not on file    Attends meetings of clubs or organizations: Not on file    Relationship status: Not on file  . Intimate partner violence    Fear of current or ex partner: Not on file    Emotionally abused: Not on file    Physically abused: Not on file    Forced sexual activity: Not on file  Other Topics Concern  . Not on file  Social History Narrative   Married (her pt is a husband of mine as well), 3 children, 1 grand child.   Educ: college.   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   Tob: current as of 05/2017; 15 pack-yr hx.   [id number]    Outpatient Medications Prior to Visit  Medication Sig Dispense Refill  . cholecalciferol (VITAMIN D) 1000 units tablet Take 1,000 Units by mouth daily.    . Multiple Vitamin (MULTIVITAMIN) tablet Take 1 tablet by mouth daily.    . pantoprazole (PROTONIX) 40 MG tablet Take 1 tablet (40 mg total) by mouth daily. 30 tablet 5  . Turmeric 400 MG  CAPS Take by mouth.    . vitamin C (ASCORBIC ACID) 500 MG tablet Take 500 mg by mouth daily.    Marland Kitchen ALPRAZolam (XANAX) 0.5 MG tablet 1/2-1 tab po bid prn anxiety 30 tablet 1  . ferrous sulfate 325 (65 FE) MG EC tablet Take 325 mg by mouth 3 (three) times daily with meals.     No facility-administered medications prior to visit.     Allergies  Allergen Reactions  . Latex Rash    ROS Review of Systems  Constitutional: Negative for appetite change, chills, fatigue and fever.  HENT: Negative for congestion, dental problem, ear pain and sore throat.   Eyes: Negative for discharge, redness and visual disturbance.  Respiratory: Negative for cough, chest tightness, shortness of breath and wheezing.   Cardiovascular: Negative for chest pain, palpitations and leg swelling.  Gastrointestinal: Negative for abdominal pain, blood in stool, diarrhea, nausea and vomiting.  Genitourinary: Negative for difficulty urinating, dysuria, flank pain, frequency, hematuria and urgency.  Musculoskeletal: Negative for arthralgias, back pain, joint swelling, myalgias and neck stiffness.  Skin: Negative for pallor and rash.  Neurological: Negative for dizziness, speech difficulty, weakness and headaches.  Hematological: Negative for adenopathy. Does not bruise/bleed easily.  Psychiatric/Behavioral: Negative for confusion and sleep disturbance. The patient is not nervous/anxious.     PE; Blood pressure 123/76, pulse 76, temperature 98.4 F (36.9 C), temperature source Temporal, resp. rate 16, height 5\' 3"  (1.6 m), weight 123 lb 12.8 oz (56.2 kg), SpO2 98 %. Body mass index is 21.93 kg/m.  Gen: Alert, well appearing.  Patient is oriented to person, place, time, and situation. AFFECT: pleasant, lucid thought and speech. ENT: Ears: EACs clear, normal epithelium.  TMs with good light reflex and landmarks bilaterally.  Eyes: no injection, icteris, swelling, or exudate.  EOMI, PERRLA. Nose: no drainage or turbinate  edema/swelling.  No injection or focal lesion.  Mouth: lips without lesion/swelling.  Oral mucosa pink and moist.  Dentition intact and without obvious caries or gingival swelling.  Oropharynx without erythema, exudate, or swelling.  Neck: supple/nontender.  No LAD, mass, or TM.  Carotid pulses 2+ bilaterally, without bruits. CV: RRR, no m/r/g.   LUNGS: CTA bilat, nonlabored resps, good aeration in  all lung fields.  Purse-lipped breathing noted on exhalation. ABD: soft, NT, ND, BS normal.  No hepatospenomegaly or mass.  No bruits. EXT: no clubbing, cyanosis, or edema.  Musculoskeletal: no joint swelling, erythema, warmth, or tenderness.  ROM of all joints intact. Skin - no sores or suspicious lesions or rashes or color changes   Pertinent labs:  Lab Results  Component Value Date   TSH 1.49 05/24/2017   Lab Results  Component Value Date   WBC 8.3 01/29/2018   HGB 15.1 (H) 01/29/2018   HCT 45.0 01/29/2018   MCV 94.4 01/29/2018   PLT 249.0 01/29/2018   Lab Results  Component Value Date   IRON 137 01/29/2018   IRON 125 01/29/2018   TIBC 320 01/29/2018   FERRITIN 57.0 01/29/2018    Lab Results  Component Value Date   CREATININE 0.92 05/10/2017   BUN 10 05/10/2017   NA 139 05/10/2017   K 4.4 05/10/2017   CL 103 05/10/2017   CO2 31 05/10/2017   Lab Results  Component Value Date   ALT 19 05/10/2017   AST 27 05/10/2017   ALKPHOS 60 05/10/2017   BILITOT 0.3 05/10/2017   Lab Results  Component Value Date   CHOL 163 05/24/2017   Lab Results  Component Value Date   HDL 83.30 05/24/2017   Lab Results  Component Value Date   LDLCALC 62 05/24/2017   Lab Results  Component Value Date   TRIG 87.0 05/24/2017   Lab Results  Component Value Date   CHOLHDL 2 05/24/2017   No results found for: HGBA1C  ASSESSMENT AND PLAN:   Health maintenance exam: Reviewed age and gender appropriate health maintenance issues (prudent diet, regular exercise, health risks of tobacco  and excessive alcohol, use of seatbelts, fire alarms in home, use of sunscreen).  Also reviewed age and gender appropriate health screening as well as vaccine recommendations. Vaccines: Flu vaccine->given today.   Labs: fasting HP labs ordered. DEXA: ->via GYN--pt is due.with GYN. Cervical ca screening: Has GYN at [facility], she'll be making f/u soon. Breast ca screening: "       "        "       "         "         "        "         "         ". Colon ca screening: next colonoscopy due 2029.   Pt on benzo for anxiety->for situational anxiety.  Uses this responsibly. Most recent RF on PMP AWARE was 09/20/18, #30, no remaining RF's, rx'd by myself. CSC updated today.  UDS today--should show benzo.  An After Visit Summary was printed and given to the patient.  FOLLOW UP:  Return in about 6 months (around 04/22/2019) for routine chronic illness f/u.  Signed:  [name], MD           10/23/2018

## 2018-10-23 NOTE — Patient Instructions (Signed)
Health Maintenance, Female Adopting a healthy lifestyle and getting preventive care are important in promoting health and wellness. Ask your health care provider about:  The right schedule for you to have regular tests and exams.  Things you can do on your own to prevent diseases and keep yourself healthy. What should I know about diet, weight, and exercise? Eat a healthy diet   Eat a diet that includes plenty of vegetables, fruits, low-fat dairy products, and lean protein.  Do not eat a lot of foods that are high in solid fats, added sugars, or sodium. Maintain a healthy weight Body mass index (BMI) is used to identify weight problems. It estimates body fat based on height and weight. Your health care provider can help determine your BMI and help you achieve or maintain a healthy weight. Get regular exercise Get regular exercise. This is one of the most important things you can do for your health. Most adults should:  Exercise for at least 150 minutes each week. The exercise should increase your heart rate and make you sweat (moderate-intensity exercise).  Do strengthening exercises at least twice a week. This is in addition to the moderate-intensity exercise.  Spend less time sitting. Even light physical activity can be beneficial. Watch cholesterol and blood lipids Have your blood tested for lipids and cholesterol at 66 years of age, then have this test every 5 years. Have your cholesterol levels checked more often if:  Your lipid or cholesterol levels are high.  You are older than 66 years of age.  You are at high risk for heart disease. What should I know about cancer screening? Depending on your health history and family history, you may need to have cancer screening at various ages. This may include screening for:  Breast cancer.  Cervical cancer.  Colorectal cancer.  Skin cancer.  Lung cancer. What should I know about heart disease, diabetes, and high blood  pressure? Blood pressure and heart disease  High blood pressure causes heart disease and increases the risk of stroke. This is more likely to develop in people who have high blood pressure readings, are of African descent, or are overweight.  Have your blood pressure checked: ? Every 3-5 years if you are 18-39 years of age. ? Every year if you are 40 years old or older. Diabetes Have regular diabetes screenings. This checks your fasting blood sugar level. Have the screening done:  Once every three years after age 40 if you are at a normal weight and have a low risk for diabetes.  More often and at a younger age if you are overweight or have a high risk for diabetes. What should I know about preventing infection? Hepatitis B If you have a higher risk for hepatitis B, you should be screened for this virus. Talk with your health care provider to find out if you are at risk for hepatitis B infection. Hepatitis C Testing is recommended for:  Everyone born from 1945 through 1965.  Anyone with known risk factors for hepatitis C. Sexually transmitted infections (STIs)  Get screened for STIs, including gonorrhea and chlamydia, if: ? You are sexually active and are younger than 66 years of age. ? You are older than 66 years of age and your health care provider tells you that you are at risk for this type of infection. ? Your sexual activity has changed since you were last screened, and you are at increased risk for chlamydia or gonorrhea. Ask your health care provider if   you are at risk.  Ask your health care provider about whether you are at high risk for HIV. Your health care provider may recommend a prescription medicine to help prevent HIV infection. If you choose to take medicine to prevent HIV, you should first get tested for HIV. You should then be tested every 3 months for as long as you are taking the medicine. Pregnancy  If you are about to stop having your period (premenopausal) and  you may become pregnant, seek counseling before you get pregnant.  Take 400 to 800 micrograms (mcg) of folic acid every day if you become pregnant.  Ask for birth control (contraception) if you want to prevent pregnancy. Osteoporosis and menopause Osteoporosis is a disease in which the bones lose minerals and strength with aging. This can result in bone fractures. If you are 65 years old or older, or if you are at risk for osteoporosis and fractures, ask your health care provider if you should:  Be screened for bone loss.  Take a calcium or vitamin D supplement to lower your risk of fractures.  Be given hormone replacement therapy (HRT) to treat symptoms of menopause. Follow these instructions at home: Lifestyle  Do not use any products that contain nicotine or tobacco, such as cigarettes, e-cigarettes, and chewing tobacco. If you need help quitting, ask your health care provider.  Do not use street drugs.  Do not share needles.  Ask your health care provider for help if you need support or information about quitting drugs. Alcohol use  Do not drink alcohol if: ? Your health care provider tells you not to drink. ? You are pregnant, may be pregnant, or are planning to become pregnant.  If you drink alcohol: ? Limit how much you use to 0-1 drink a day. ? Limit intake if you are breastfeeding.  Be aware of how much alcohol is in your drink. In the [place] one drink equals one 12 oz bottle of beer (355 mL), one 5 oz glass of wine (148 mL), or one 1 oz glass of hard liquor (44 mL). General instructions  Schedule regular health, dental, and eye exams.  Stay current with your vaccines.  Tell your health care provider if: ? You often feel depressed. ? You have ever been abused or do not feel safe at home. Summary  Adopting a healthy lifestyle and getting preventive care are important in promoting health and wellness.  Follow your health care provider's instructions about healthy  diet, exercising, and getting tested or screened for diseases.  Follow your health care provider's instructions on monitoring your cholesterol and blood pressure. This information is not intended to replace advice given to you by your health care provider. Make sure you discuss any questions you have with your health care provider. Document Released: 08/02/2010 Document Revised: 01/10/2018 Document Reviewed: 01/10/2018 Elsevier Patient Education  2020 [employer] Inc.  

## 2018-10-26 LAB — PAIN MGMT, PROFILE 8 W/CONF, U
6 Acetylmorphine: NEGATIVE ng/mL
Alcohol Metabolites: NEGATIVE ng/mL (ref <500)
Alphahydroxyalprazolam: 25 ng/mL
Alphahydroxymidazolam: NEGATIVE ng/mL
Alphahydroxytriazolam: NEGATIVE ng/mL
Aminoclonazepam: NEGATIVE ng/mL
Amphetamines: NEGATIVE ng/mL
Benzodiazepines: POSITIVE ng/mL
Buprenorphine, Urine: NEGATIVE ng/mL
Cocaine Metabolite: NEGATIVE ng/mL
Creatinine: 15.6 mg/dL
Hydroxyethylflurazepam: NEGATIVE ng/mL
Lorazepam: NEGATIVE ng/mL
MDMA: NEGATIVE ng/mL
Marijuana Metabolite: NEGATIVE ng/mL
Nordiazepam: NEGATIVE ng/mL
Opiates: NEGATIVE ng/mL
Oxazepam: NEGATIVE ng/mL
Oxidant: NEGATIVE ug/mL
Oxycodone: NEGATIVE ng/mL
Specific Gravity: 1.004 (ref >=1.0)
Temazepam: NEGATIVE ng/mL
pH: 6.6 (ref 4.5–9.0)

## 2019-01-08 ENCOUNTER — Other Ambulatory Visit: Payer: Self-pay

## 2019-01-08 MED ORDER — PANTOPRAZOLE SODIUM 40 MG PO TBEC: 40.0000 mg | DELAYED_RELEASE_TABLET | Freq: Every day | ORAL | 5 refills | Status: DC

## 2019-01-13 IMAGING — US US EXTREM LOW VENOUS*R*
1 series · 13 of 24 positions shown · non-contrast
Comparison: None.

CLINICAL DATA: Acute right calf pain and swelling for 1 week



[Series 1: us extrem low venous*right* · 0.05mm/px · 13 of 41 slices shown]
[im 1/41]
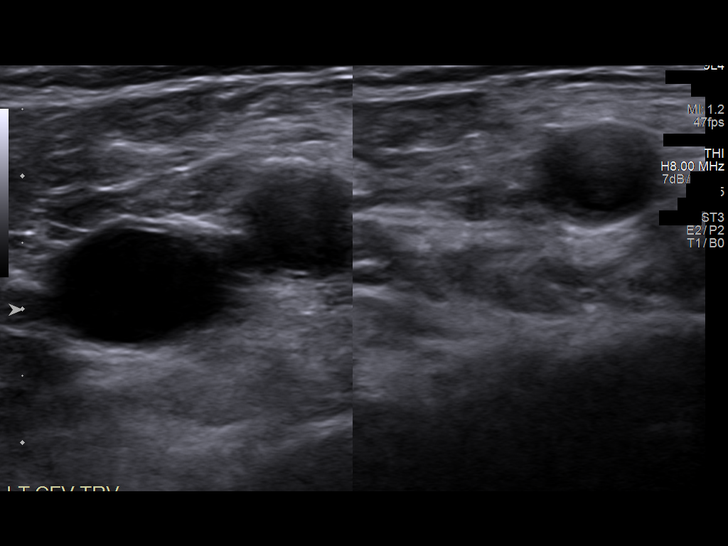
[im 4/41]
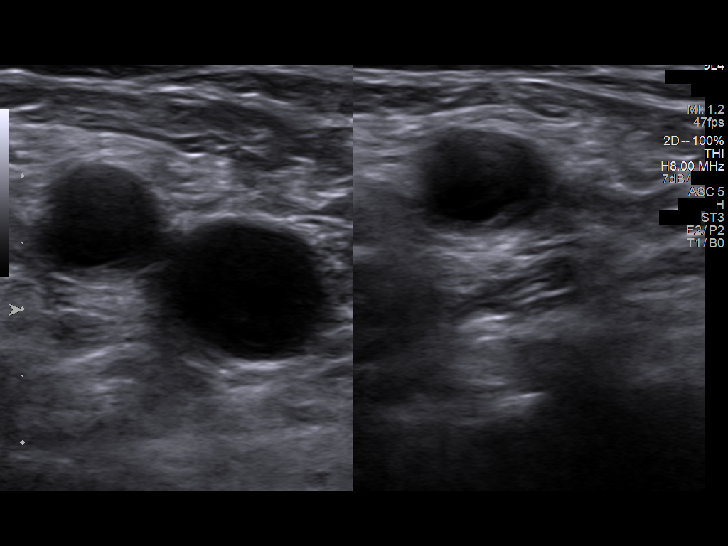
[im 7/41]
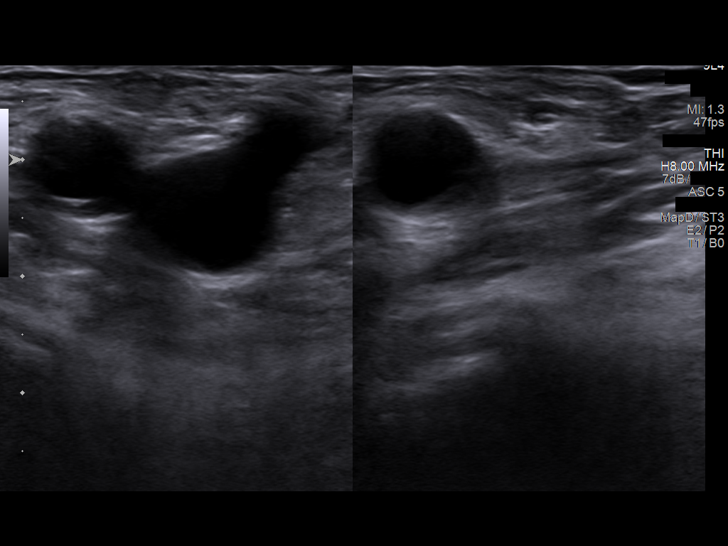
[im 11/41]
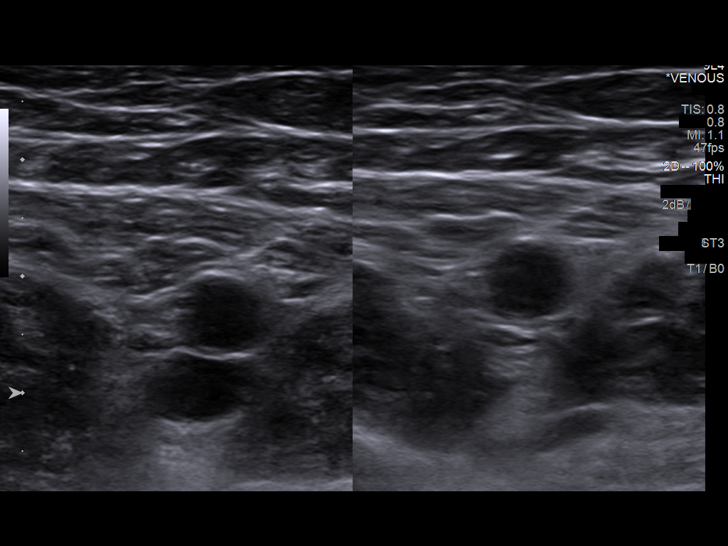
[im 14/41]
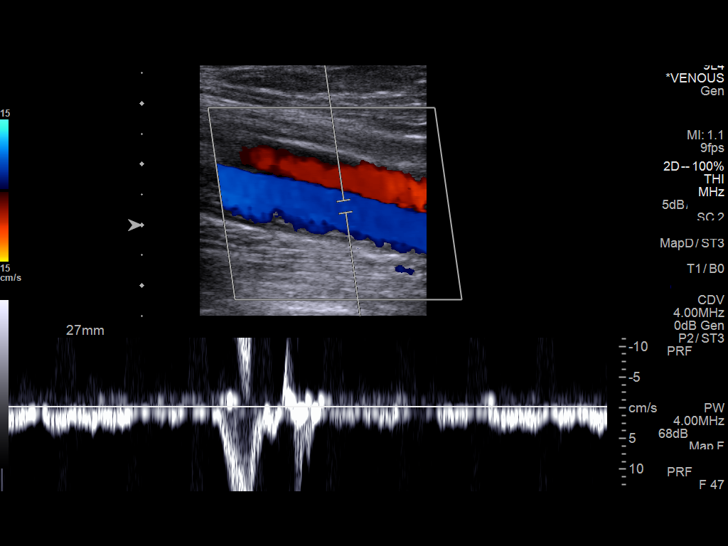
[im 18/41]
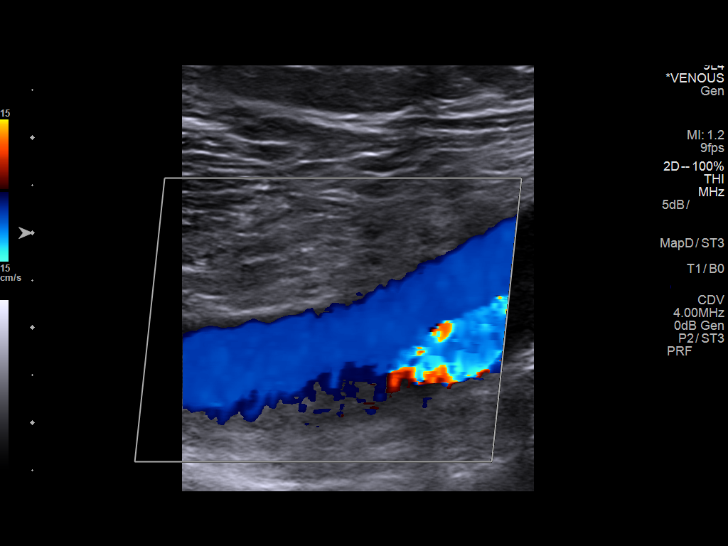
[im 21/41]
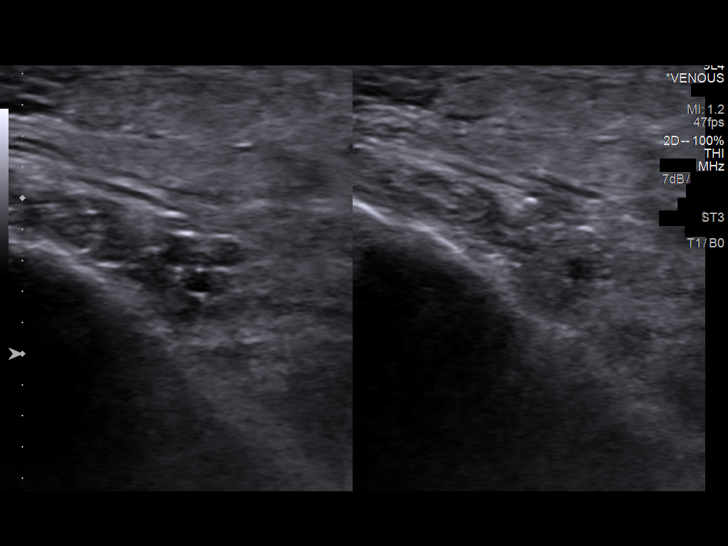
[im 23/41]
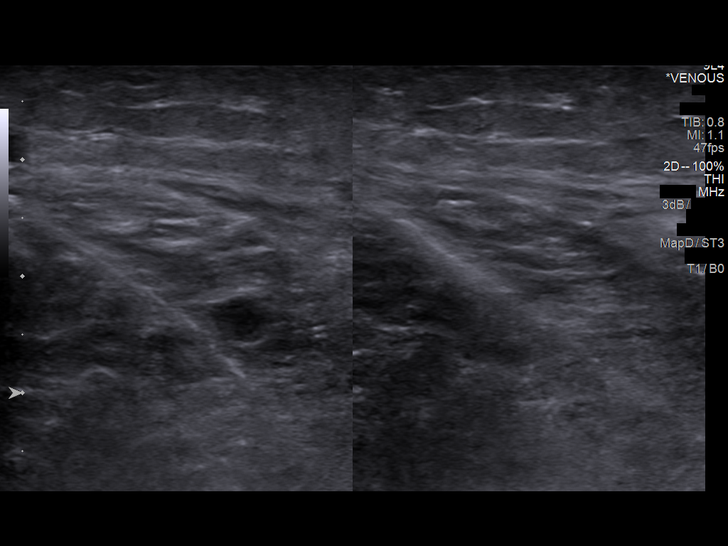
[im 27/41]
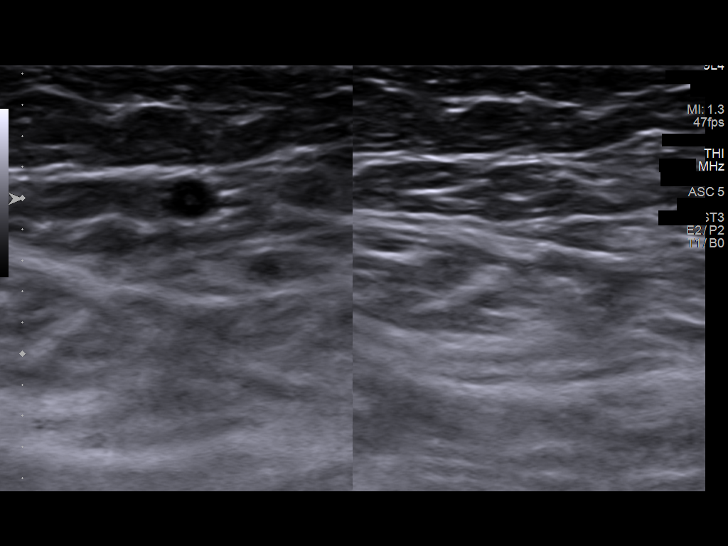
[im 30/41]
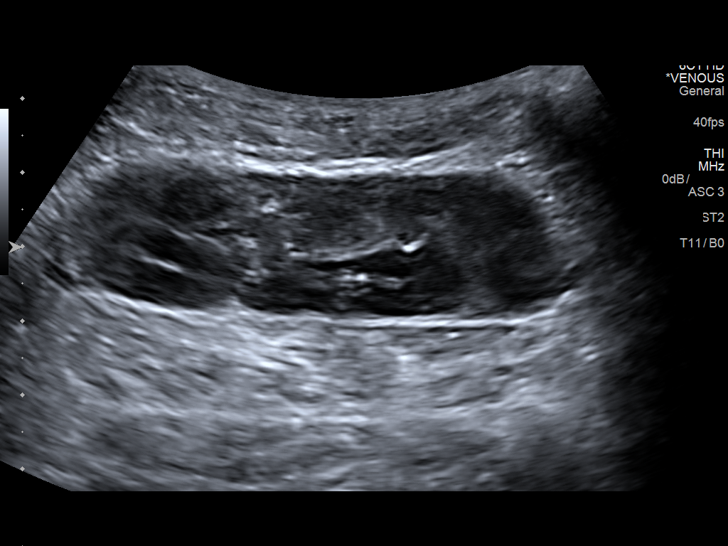
[im 34/41]
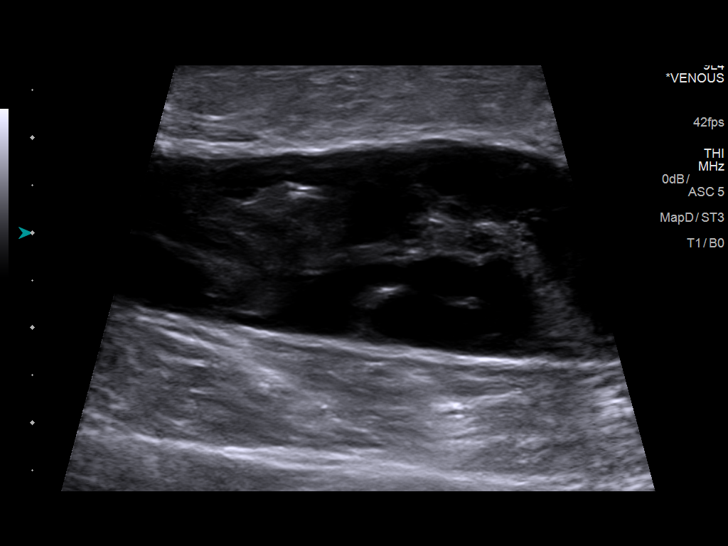
[im 37/41]
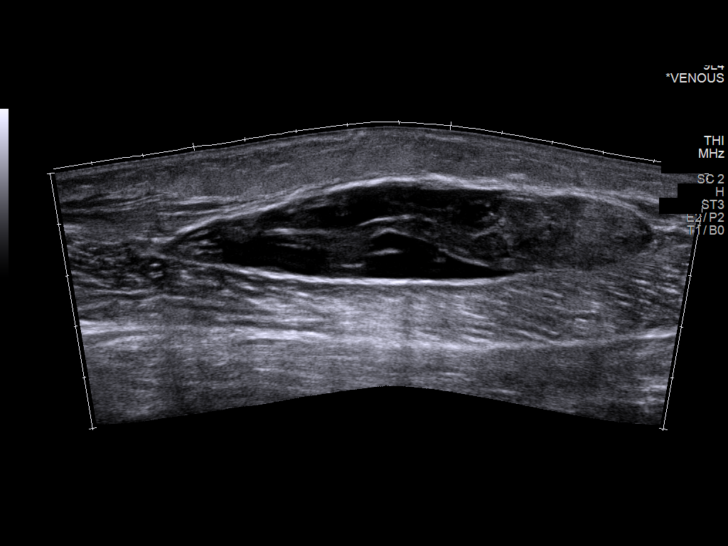
[im 41/41]
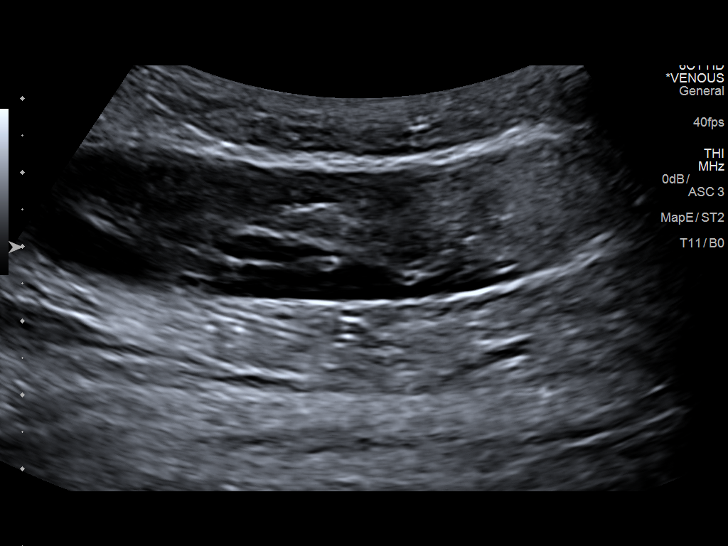

[13 of 24 positions shown; findings below may reference images not displayed]

FINDINGS: Contralateral Common Femoral Vein: Respiratory phasicity is normal
and symmetric with the symptomatic side. No evidence of thrombus.
Normal compressibility.

Common Femoral Vein: No evidence of thrombus. Normal
compressibility, respiratory phasicity and response to augmentation.

Saphenofemoral Junction: No evidence of thrombus. Normal
compressibility and flow on color Doppler imaging.

Profunda Femoral Vein: No evidence of thrombus. Normal
compressibility and flow on color Doppler imaging.

Femoral Vein: No evidence of thrombus. Normal compressibility,
respiratory phasicity and response to augmentation.

Popliteal Vein: No evidence of thrombus. Normal compressibility,
respiratory phasicity and response to augmentation.

Calf Veins: No evidence of thrombus. Normal compressibility and flow
on color Doppler imaging.

Superficial Great Saphenous Vein: No evidence of thrombus. Normal
compressibility and flow on color Doppler imaging.

Venous Reflux:  None.

Other Findings: Posterior right calf complex elongated hypoechoic
and partially cystic area measures 9.6 x 2.1 x 6.4 cm. No associated
vascularity. Appearance compatible with soft tissue hematoma.
IMPRESSION: No evidence of DVT within the right lower extremity.

Findings compatible with posterior right calf soft tissue hematoma.

## 2019-02-01 DIAGNOSIS — U071 COVID-19: Secondary | ICD-10-CM

## 2019-02-01 HISTORY — DX: COVID-19: U07.1

## 2019-03-06 DIAGNOSIS — C44319 Basal cell carcinoma of skin of other parts of face: Secondary | ICD-10-CM | POA: Diagnosis not present

## 2019-05-28 ENCOUNTER — Other Ambulatory Visit: Payer: Self-pay

## 2019-05-28 MED ORDER — ALPRAZOLAM 0.5 MG PO TABS: ORAL_TABLET | ORAL | 0 refills | Status: DC

## 2019-05-28 NOTE — Telephone Encounter (Signed)
Requesting: alprazolam Contract:10/23/18 UDS:10/23/18 Last Visit:10/23/18 Next Visit: advised to f/u 6 mo. March  Last Refill:10/23/18(135,1)  Please Advise. Medication pending

## 2019-05-28 NOTE — Telephone Encounter (Signed)
LM for pt to return call to schedule next f/u appt prior to any additional refills.

## 2019-05-28 NOTE — Telephone Encounter (Signed)
Rx for #45 printed and faxed to pt's pharmacy.

## 2019-05-28 NOTE — Telephone Encounter (Signed)
I will eRx #45, but I must see her for appropriate f/u for this med prior to any FURTHER RF's-thx

## 2019-07-15 ENCOUNTER — Other Ambulatory Visit: Payer: Self-pay

## 2019-07-15 MED ORDER — ALPRAZOLAM 0.5 MG PO TABS: ORAL_TABLET | ORAL | 0 refills | Status: DC

## 2019-07-15 MED ORDER — PANTOPRAZOLE SODIUM 40 MG PO TBEC: 40.0000 mg | DELAYED_RELEASE_TABLET | Freq: Every day | ORAL | 5 refills | Status: DC

## 2019-07-15 NOTE — Telephone Encounter (Signed)
Patient refill request.  Patient scheduled appt for next Monday 07/22/19.   Patient was not able to schedule appt because she keeps her grandchildren for her daughter who is a Education officer, museum.  I told patient that I was unable to let her if refill would be approved or if they would give her enough meds to her appt.  Dr. Idelle Leech clinical assistant would be following up with her. [phone]  ALPRAZolam (XANAX) 0.5 MG tablet   pantoprazole (PROTONIX) 40 MG tablet    [id number] - [place], [place] - [address] AT [address]

## 2019-07-15 NOTE — Telephone Encounter (Signed)
I'll RF pantoprazole as before BUT I'll only do #15 of the alprazolam.  Way overdue for f/u anxiety/benzo---virtual f/u is fine.

## 2019-07-15 NOTE — Telephone Encounter (Signed)
Patient scheduled for 07/22/19. Advised medication has been sent in

## 2019-07-15 NOTE — Telephone Encounter (Signed)
Requesting:alprazolam Contract:10/23/18 UDS:10/23/18 Last Visit:10/23/18 Next Visit:advised to f/u 6 months Last Refill:05/28/19(45,0)  RF request for Pantoprazole LOV:10/23/18 Next ov: advised to f/u 6 months Last written:01/08/19(30,5)   Please Advise. Medications pending

## 2019-07-16 DIAGNOSIS — L570 Actinic keratosis: Secondary | ICD-10-CM | POA: Diagnosis not present

## 2019-07-22 ENCOUNTER — Ambulatory Visit (INDEPENDENT_AMBULATORY_CARE_PROVIDER_SITE_OTHER): Payer: Medicare PPO | Admitting: Family Medicine

## 2019-07-22 ENCOUNTER — Encounter: Payer: Self-pay | Admitting: Family Medicine

## 2019-07-22 ENCOUNTER — Other Ambulatory Visit: Payer: Self-pay

## 2019-07-22 VITALS — BP 122/82 | HR 87 | Temp 98.0°F | Resp 17 | Ht 63.0 in | Wt 130.5 lb

## 2019-07-22 DIAGNOSIS — Z79899 Other long term (current) drug therapy: Secondary | ICD-10-CM

## 2019-07-22 DIAGNOSIS — F411 Generalized anxiety disorder: Secondary | ICD-10-CM

## 2019-07-22 DIAGNOSIS — Z862 Personal history of diseases of the blood and blood-forming organs and certain disorders involving the immune mechanism: Secondary | ICD-10-CM

## 2019-07-22 DIAGNOSIS — F5104 Psychophysiologic insomnia: Secondary | ICD-10-CM | POA: Diagnosis not present

## 2019-07-22 DIAGNOSIS — Z8719 Personal history of other diseases of the digestive system: Secondary | ICD-10-CM | POA: Diagnosis not present

## 2019-07-22 DIAGNOSIS — F17200 Nicotine dependence, unspecified, uncomplicated: Secondary | ICD-10-CM | POA: Diagnosis not present

## 2019-07-22 MED ORDER — ALPRAZOLAM 0.5 MG PO TABS: ORAL_TABLET | ORAL | 1 refills | Status: DC

## 2019-07-22 NOTE — Progress Notes (Signed)
OFFICE VISIT  07/22/2019  CC:  Chief Complaint  Patient presents with  . Anxiety    Needs refills on meds   . Gastroesophageal Reflux   HPI:    Patient is a 69 y.o. female who presents for f/u anxiety and hx of iron def anemia (endoscopic w/u 05/2017 revealed h pylori NEG gastritis).  I last saw her 10/23/18 for her CPE.  At that time all blood tests were normal and her UDS showed appropriate results. 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  Hx of IDA/Gastritis: no iron tab but takes 1 MVI qd. Eating iron-rich diet.  No abd pain. EAting fine, no dysphagia.   Takes xanax nightly to help with sleep and sometimes for extra anxiety during the day.   PMP AWARE reviewed today: most recent rx for alpraz 0.5mg  was filled 07/15/19, # 15, rx by me. No red flags.  Smoking cigs?->yes, but cutting down--says half a pack/day. Wants to continue cutting back slowly with nicotine "gummies".    Past Medical History:  Diagnosis Date  . Allergic rhinitis   . Anxiety   . Arthritis    low back and both hands  . COPD (chronic obstructive pulmonary disease) ([place])    asymptomatic BUT has purse-lipped breathing on exam even when feeling well  . Gastritis 08/17/2017   EGD  . Iron deficiency anemia 05/2017     Hemoccults neg 05/26/17.  Colonoscopy -->no culprit.  EGD--> gastritis (h pyl NEG).  . Melanoma ([place])    Near umbilicus. close f/u with Dr. Delman Cheadle (annual)  . Tobacco dependence     Past Surgical History:  Procedure Laterality Date  . CESAREAN SECTION    . COLONOSCOPY  08/17/2017   Done for unexplained IDA-->Diverticulosis, non-bleeding internal hem, o/w normal.  Repeat 10 yrs.  . ESOPHAGOGASTRODUODENOSCOPY  08/17/2017   +Gastritis.  Gastric bx "reactive gastritis".  Bx for H pylori NEG.        Duodenum bx: NORMAL.  Marland Kitchen EYE SURGERY    . MELANOMA EXCISION      Outpatient Medications Prior to Visit  Medication Sig Dispense  Refill  . cholecalciferol (VITAMIN D) 1000 units tablet Take 1,000 Units by mouth daily.    . Multiple Vitamin (MULTIVITAMIN) tablet Take 1 tablet by mouth daily.    . pantoprazole (PROTONIX) 40 MG tablet Take 1 tablet (40 mg total) by mouth daily. 30 tablet 5  . vitamin C (ASCORBIC ACID) 500 MG tablet Take 500 mg by mouth daily.    Marland Kitchen ALPRAZolam (XANAX) 0.5 MG tablet 1/2-1 tab po bid prn anxiety 15 tablet 0  . Turmeric 400 MG CAPS Take by mouth. (Patient not taking: Reported on 07/22/2019)     No facility-administered medications prior to visit.    Allergies  Allergen Reactions  . Latex Rash    ROS As per HPI  PE: Blood pressure 122/82, pulse 87, temperature 98 F (36.7 C), temperature source Temporal, resp. rate 17, height 5\' 3"  (1.6 m), weight 130 lb 8 oz (59.2 kg), SpO2 94 %. Body mass index is 23.12 kg/m.  Gen: Alert, well appearing.  Patient is oriented to person, place, time, and situation. AFFECT: pleasant, lucid thought and speech. JJK:KXFG: no injection, icteris, swelling, or exudate.  EOMI, PERRLA. Mouth: lips without lesion/swelling.  Oral mucosa pink and moist. Oropharynx without erythema, exudate, or swelling.  CV: RRR, no m/r/g.   LUNGS: CTA bilat, nonlabored resps, good aeration  in all lung fields. EXT: no clubbing or cyanosis.  no edema.    LABS:  Lab Results  Component Value Date   TSH 1.56 10/23/2018   Lab Results  Component Value Date   WBC 7.4 10/23/2018   HGB 15.0 10/23/2018   HCT 44.8 10/23/2018   MCV 93.5 10/23/2018   PLT 249.0 10/23/2018   Lab Results  Component Value Date   IRON 137 01/29/2018   IRON 125 01/29/2018   TIBC 320 01/29/2018   FERRITIN 57.0 01/29/2018    Lab Results  Component Value Date   CREATININE 0.69 10/23/2018   BUN 14 10/23/2018   NA 141 10/23/2018   K 4.8 10/23/2018   CL 103 10/23/2018   CO2 32 10/23/2018   Lab Results  Component Value Date   ALT 18 10/23/2018   AST 19 10/23/2018   ALKPHOS 63 10/23/2018    BILITOT 0.6 10/23/2018   Lab Results  Component Value Date   CHOL 177 10/23/2018   Lab Results  Component Value Date   HDL 79.90 10/23/2018   Lab Results  Component Value Date   LDLCALC 81 10/23/2018   Lab Results  Component Value Date   TRIG 76.0 10/23/2018   Lab Results  Component Value Date   CHOLHDL 2 10/23/2018    IMPRESSION AND PLAN:  1) GAD with anxiety-induced insomnia. The current medical regimen is effective;  continue present plan and medications. Will renew [place] and UDS at next f/u in 48mo for her CPE. I eRx'd alpraz 0.5, #135, 90d supply, with 1 RF--today.  2) Hx of gastritis with IDA (but no convincing site of bleeding to explain IDA, plus her hemoccults were neg): she has done well since 2019. No longer on iron supplement. Plan on CBC recheck at next f/u in 6 mo. Continue daily protonix 40mg  qd and iron-rich diet.  3) Tobacco dependence: encouraged complete cessation but she is hanging on to this habit, wants to continue to slowly cut back and use nicotine replacement prn.  An After Visit Summary was printed and given to the patient.  FOLLOW UP: Return in about 6 months (around 01/21/2020) for annual CPE (fasting).  Signed:  [name], MD           07/22/2019

## 2019-09-20 DIAGNOSIS — D2371 Other benign neoplasm of skin of right lower limb, including hip: Secondary | ICD-10-CM | POA: Diagnosis not present

## 2019-09-20 DIAGNOSIS — L570 Actinic keratosis: Secondary | ICD-10-CM | POA: Diagnosis not present

## 2019-09-20 DIAGNOSIS — L821 Other seborrheic keratosis: Secondary | ICD-10-CM | POA: Diagnosis not present

## 2019-09-20 DIAGNOSIS — Z85820 Personal history of malignant melanoma of skin: Secondary | ICD-10-CM | POA: Diagnosis not present

## 2019-09-20 DIAGNOSIS — L578 Other skin changes due to chronic exposure to nonionizing radiation: Secondary | ICD-10-CM | POA: Diagnosis not present

## 2019-09-20 DIAGNOSIS — L820 Inflamed seborrheic keratosis: Secondary | ICD-10-CM | POA: Diagnosis not present

## 2019-09-20 DIAGNOSIS — D224 Melanocytic nevi of scalp and neck: Secondary | ICD-10-CM | POA: Diagnosis not present

## 2019-09-20 DIAGNOSIS — D225 Melanocytic nevi of trunk: Secondary | ICD-10-CM | POA: Diagnosis not present

## 2019-09-20 DIAGNOSIS — D2261 Melanocytic nevi of right upper limb, including shoulder: Secondary | ICD-10-CM | POA: Diagnosis not present

## 2019-09-20 DIAGNOSIS — L723 Sebaceous cyst: Secondary | ICD-10-CM | POA: Diagnosis not present

## 2019-12-04 ENCOUNTER — Telehealth: Payer: Self-pay

## 2019-12-04 MED ORDER — POLYMYXIN B-TRIMETHOPRIM 10000-0.1 UNIT/ML-% OP SOLN: 2.0000 [drp] | Freq: Four times a day (QID) | OPHTHALMIC | 0 refills | Status: DC

## 2019-12-04 NOTE — Telephone Encounter (Signed)
Please advise, thanks.

## 2019-12-04 NOTE — Telephone Encounter (Signed)
Patient called back, I relayed Dr. Idelle Leech message and she voiced understanding. She is going to pharmacy to pick up RX.

## 2019-12-04 NOTE — Telephone Encounter (Signed)
OK I'll eRx drops to treat possible bacterial conjunctivitis but remind her that a VERY LARGE percentage of pink eye cases are from a VIRUS and the antibacterial drops do not help.

## 2019-12-04 NOTE — Telephone Encounter (Signed)
Patient states she has pink eye. She states both eyes are bright red and and has discharge.  Patient declined appt.  She states that Dr. Anitra Lauth will call in an antibiotic for her.  She reports her grandson had pink eye  Week. 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  Patient can be reached at [phone]

## 2019-12-04 NOTE — Telephone Encounter (Signed)
LM for pt to returncall

## 2019-12-18 DIAGNOSIS — L570 Actinic keratosis: Secondary | ICD-10-CM | POA: Diagnosis not present

## 2019-12-18 DIAGNOSIS — D485 Neoplasm of uncertain behavior of skin: Secondary | ICD-10-CM | POA: Diagnosis not present

## 2020-01-09 ENCOUNTER — Other Ambulatory Visit: Payer: Self-pay | Admitting: Family Medicine

## 2020-01-10 NOTE — Telephone Encounter (Signed)
Requesting: xanax Contract:10/23/2018 UDS:10/23/2018 Last Visit:07/22/19 Next Visit:n/a Last Refill: 07/22/19 (135,1)  Please Advise

## 2020-01-11 NOTE — Telephone Encounter (Signed)
Rx for alpraz sent in. Pt needs f/u with me in the next 4-6 wks.

## 2020-01-13 ENCOUNTER — Telehealth: Payer: Self-pay

## 2020-01-13 NOTE — Telephone Encounter (Signed)
Pt needs to schedule [facility] office visit 4-6 week f/u

## 2020-01-13 NOTE — Telephone Encounter (Signed)
Patient called office to say that she missed a call from our office this morning. I found encounter where pt was requesting refill on Alprazolam.  Dr. Anitra Lauth approved 30 d/s of meds, however, patient needs to schedule [facility] office visit 4-6 f/u. I asked patient to schedule appt for 4-6 weeks, she declined. Patient stated she will call back closer to running out of meds. I advised her to call within the next 2-3 weeks to schedule with Dr. Anitra Lauth, his schedule gets full very fast, and we want to make sure we accomodate her request and not go without her meds. Patient expressed understanding. Close encounter.

## 2020-01-13 NOTE — Telephone Encounter (Signed)
Attempted to contact pt to schedule f/u appt

## 2020-01-14 ENCOUNTER — Other Ambulatory Visit: Payer: Self-pay | Admitting: Family Medicine

## 2020-02-19 ENCOUNTER — Encounter: Payer: Medicare PPO | Admitting: Family Medicine

## 2020-02-19 ENCOUNTER — Telehealth: Payer: Self-pay

## 2020-02-19 ENCOUNTER — Encounter: Payer: Self-pay | Admitting: Family Medicine

## 2020-02-19 NOTE — Progress Notes (Deleted)
Virtual Visit via Video Note  I connected with on 02/19/20 at  4:00 PM EST by a video enabled telemedicine application and verified that I am speaking with the correct person using two identifiers.  Location patient: home, [place] Location provider:work or home office Persons participating in the virtual visit: patient, provider  I discussed the limitations of evaluation and management by telemedicine and the availability of in person appointments. The patient expressed understanding and agreed to proceed.  Telemedicine visit is a necessity given the COVID-19 restrictions in place at the current time.  HPI: 70 y/o WF being seen today for respiratory symptoms.   ROS: See pertinent positives and negatives per HPI.  Past Medical History:  Diagnosis Date  . Allergic rhinitis   . Anxiety   . Arthritis    low back and both hands  . COPD (chronic obstructive pulmonary disease) ([place])    asymptomatic BUT has purse-lipped breathing on exam even when feeling well  . Gastritis 08/17/2017   EGD  . Iron deficiency anemia 05/2017     Hemoccults neg 05/26/17.  Colonoscopy -->no culprit.  EGD--> gastritis (h pyl NEG).  . Melanoma ([place])    Near umbilicus. close f/u with Dr. Delman Cheadle (annual)  . Tobacco dependence     Past Surgical History:  Procedure Laterality Date  . CESAREAN SECTION    . COLONOSCOPY  08/17/2017   Done for unexplained IDA-->Diverticulosis, non-bleeding internal hem, o/w normal.  Repeat 10 yrs.  . ESOPHAGOGASTRODUODENOSCOPY  08/17/2017   +Gastritis.  Gastric bx "reactive gastritis".  Bx for H pylori NEG.        Duodenum bx: NORMAL.  Marland Kitchen EYE SURGERY    . MELANOMA EXCISION       Current Outpatient Medications:  .  ALPRAZolam (XANAX) 0.5 MG tablet, TAKE 1/2 TO 1 TABLET BY MOUTH TWICE DAILY AS NEEDED FOR ANXIETY, Disp: 135 tablet, Rfl: 0 .  cholecalciferol (VITAMIN D) 1000 units tablet, Take 1,000 Units by mouth daily., Disp: , Rfl:  .  Multiple Vitamin (MULTIVITAMIN) tablet, Take 1  tablet by mouth daily., Disp: , Rfl:  .  pantoprazole (PROTONIX) 40 MG tablet, TAKE 1 TABLET(40 MG) BY MOUTH DAILY, Disp: 30 tablet, Rfl: 1 .  trimethoprim-polymyxin b (POLYTRIM) ophthalmic solution, Place 2 drops into both eyes every 6 (six) hours., Disp: 10 mL, Rfl: 0 .  vitamin C (ASCORBIC ACID) 500 MG tablet, Take 500 mg by mouth daily., Disp: , Rfl:   EXAM:  VITALS per patient if applicable:  Vitals with BMI 07/22/2019 10/23/2018 01/29/2018  Height 5\' 3"  5\' 3"  5' 3.5"  Weight 130 lbs 8 oz 123 lbs 13 oz 125 lbs  BMI 23.12 62.70 35.00  Systolic 938 182 993  Diastolic 82 76 82  Pulse 87 76 93     GENERAL: alert, oriented, appears well and in no acute distress  HEENT: atraumatic, conjunttiva clear, no obvious abnormalities on inspection of external nose and ears  NECK: normal movements of the head and neck  LUNGS: on inspection no signs of respiratory distress, breathing rate appears normal, no obvious gross SOB, gasping or wheezing  CV: no obvious cyanosis  MS: moves all visible extremities without noticeable abnormality  PSYCH/NEURO: pleasant and cooperative, no obvious depression or anxiety, speech and thought processing grossly intact  LABS: none today    Chemistry      Component Value Date/Time   NA 141 10/23/2018 1015   K 4.8 10/23/2018 1015   CL 103 10/23/2018 1015   CO2  32 10/23/2018 1015   BUN 14 10/23/2018 1015   CREATININE 0.69 10/23/2018 1015      Component Value Date/Time   CALCIUM 10.2 10/23/2018 1015   ALKPHOS 63 10/23/2018 1015   AST 19 10/23/2018 1015   ALT 18 10/23/2018 1015   BILITOT 0.6 10/23/2018 1015     Lab Results  Component Value Date   WBC 7.4 10/23/2018   HGB 15.0 10/23/2018   HCT 44.8 10/23/2018   MCV 93.5 10/23/2018   PLT 249.0 10/23/2018    ASSESSMENT AND PLAN:  Discussed the following assessment and plan:  No diagnosis found.    I discussed the assessment and treatment plan with the patient. The patient was provided an  opportunity to ask questions and all were answered. The patient agreed with the plan and demonstrated an understanding of the instructions.   F/u: *** Has appt with me scheduled for 03/13/20  Signed:  [name], MD           02/19/2020

## 2020-02-19 NOTE — Telephone Encounter (Signed)
Spoke with patient to start virtual appointment with PCP, she stated she called prior to cancel original appointment due to not feeling well/weather and rescheduled to 2/11. She wanted an antibiotic to help with cough since her and husband are both covid positive. I advised this is normally something determined by provider during an appointment for proper treatment/recommendations. Advised we could still do the appointment but she declined stating she was not tech savy and daughter was supposed to help with this process.

## 2020-02-21 ENCOUNTER — Encounter: Payer: Medicare PPO | Admitting: Family Medicine

## 2020-03-13 ENCOUNTER — Encounter: Payer: Medicare PPO | Admitting: Family Medicine

## 2020-03-17 ENCOUNTER — Other Ambulatory Visit: Payer: Self-pay | Admitting: Family Medicine

## 2020-03-21 ENCOUNTER — Other Ambulatory Visit: Payer: Self-pay | Admitting: Family Medicine

## 2020-03-23 ENCOUNTER — Other Ambulatory Visit: Payer: Self-pay

## 2020-03-23 MED ORDER — PANTOPRAZOLE SODIUM 40 MG PO TBEC: DELAYED_RELEASE_TABLET | ORAL | 0 refills | Status: DC

## 2020-03-31 ENCOUNTER — Other Ambulatory Visit: Payer: Self-pay

## 2020-03-31 NOTE — Progress Notes (Signed)
Subjective:   Linda Davies is a 70 y.o. female who presents for Medicare Annual (Subsequent) preventive examination.  I connected with Nashea  today by telephone and verified that I am speaking with the correct person using two identifiers. Location patient: home Location provider: work Persons participating in the virtual visit: patient, Marine scientist.    I discussed the limitations, risks, security and privacy concerns of performing an evaluation and management service by telephone and the availability of in person appointments. I also discussed with the patient that there may be a patient responsible charge related to this service. The patient expressed understanding and verbally consented to this telephonic visit.    Interactive audio and video telecommunications were attempted between this provider and patient, however failed, due to patient having technical difficulties OR patient did not have access to video capability.  We continued and completed visit with audio only.  Some vital signs may be absent or patient reported.   Time Spent with patient on telephone encounter: 20 minutes  Review of Systems     Cardiac Risk Factors include: advanced age (>41men, >88 women);smoking/ tobacco exposure     Objective:    Today's Vitals   04/01/20 0900  Weight: 130 lb (59 kg)  Height: 5\' 3"  (1.6 m)   Body mass index is 23.03 kg/m.  Advanced Directives 04/01/2020 08/17/2017  Does Patient Have a Medical Advance Directive? Yes Yes  Type of Paramedic of [place];Living will [place];Living will;Out of facility DNR (pink MOST or yellow form)  Does patient want to make changes to medical advance directive? - No - Patient declined  Copy of [place] in Chart? No - copy requested No - copy requested    Current Medications (verified) Outpatient Encounter Medications as of 04/01/2020  Medication Sig  . ALPRAZolam (XANAX) 0.5 MG tablet TAKE  1/2 TO 1 TABLET BY MOUTH TWICE DAILY AS NEEDED FOR ANXIETY  . cholecalciferol (VITAMIN D) 1000 units tablet Take 1,000 Units by mouth daily.  . Multiple Vitamin (MULTIVITAMIN) tablet Take 1 tablet by mouth daily.  . pantoprazole (PROTONIX) 40 MG tablet TAKE 1 TABLET(40 MG) BY MOUTH DAILY  . vitamin C (ASCORBIC ACID) 500 MG tablet Take 500 mg by mouth daily.   No facility-administered encounter medications on file as of 04/01/2020.    Allergies (verified) Latex   History: Past Medical History:  Diagnosis Date  . Allergic rhinitis   . Anxiety   . Arthritis    low back and both hands  . COPD (chronic obstructive pulmonary disease) ([place])    asymptomatic BUT has purse-lipped breathing on exam even when feeling well  . Gastritis 08/17/2017   EGD  . Iron deficiency anemia 05/2017     Hemoccults neg 05/26/17.  Colonoscopy -->no culprit.  EGD--> gastritis (h pyl NEG).  . Melanoma ([place])    Near umbilicus. close f/u with Dr. Delman Cheadle (annual)  . Tobacco dependence    Past Surgical History:  Procedure Laterality Date  . CESAREAN SECTION    . COLONOSCOPY  08/17/2017   Done for unexplained IDA-->Diverticulosis, non-bleeding internal hem, o/w normal.  Repeat 10 yrs.  . ESOPHAGOGASTRODUODENOSCOPY  08/17/2017   +Gastritis.  Gastric bx "reactive gastritis".  Bx for H pylori NEG.        Duodenum bx: NORMAL.  Marland Kitchen EYE SURGERY    . MELANOMA EXCISION     Family History  Problem Relation Age of Onset  . Arthritis Mother   .  Asthma Mother   . COPD Mother   . Hypertension Mother   . Cancer Father        liver ca  . Alcohol abuse Father   . Alzheimer's disease Father   . Cancer Brother        Leukemia 2017  . Hearing loss Brother   . Early death Brother   . Alcohol abuse Maternal Grandmother   . Cancer Maternal Grandmother   . Cancer Maternal Grandfather        lung  . Alzheimer's disease Paternal Grandmother   . Cancer Paternal Grandfather        lung  . Asthma Daughter    Social History    Socioeconomic History  . Marital status: Married    Spouse name: Not on file  . Number of children: Not on file  . Years of education: Not on file  . Highest education level: Not on file  Occupational History  . Occupation: Teacher, early years/pre  Tobacco Use  . Smoking status: Current Every Day Smoker    Packs/day: 0.50    Years: 30.00    Pack years: 15.00    Types: Cigarettes  . Smokeless tobacco: Never Used  Vaping Use  . Vaping Use: Never used  Substance and Sexual Activity  . Alcohol use: Yes    Alcohol/week: 2.0 standard drinks    Types: 2 Glasses of wine per week    Comment: white wine every evening  . Drug use: Never  . Sexual activity: Yes    Partners: Male  Other Topics Concern  . Not on file  Social History Narrative   Married (her pt is a husband of mine as well), 3 children, 1 grand child.   Educ: college.   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   Tob: current as of 05/2017; 15 pack-yr hx.   [id number]   Social Determinants of Health   Financial Resource Strain: Low Risk   . Difficulty of Paying Living Expenses: Not hard at all  Food Insecurity: No Food Insecurity  . Worried About Charity fundraiser in the Last Year: Never true  . Ran Out of Food in the Last Year: Never true  Transportation Needs: No Transportation Needs  . Lack of Transportation (Medical): No  . Lack of Transportation (Non-Medical): No  Physical Activity: Inactive  . Days of Exercise per Week: 0 days  . Minutes of Exercise per Session: 0 min  Stress: No Stress Concern Present  . Feeling of Stress : Not at all  Social Connections: Moderately Integrated  . Frequency of Communication with Friends and Family: More than three times a week  . Frequency of Social Gatherings with Friends and Family: More than three times a week  . Attends Religious Services: 1 to 4 times per year  . Active Member of Clubs or Organizations: No  . Attends Archivist Meetings: Never  .  Marital Status: Married    Tobacco Counseling Ready to quit: Not Answered Counseling given: Not Answered   Clinical Intake:  Pre-visit preparation completed: Yes  Pain : No/denies pain     Nutritional Status: BMI of 19-24  Normal Nutritional Risks: None Diabetes: No  How often do you need to have someone help you when you read instructions, pamphlets, or other written materials from your doctor or pharmacy?: 1 - Never  Diabetic?No  Interpreter Needed?: No  Information entered by :: Caroleen Hamman LPN   Activities of Daily Living In your present  state of health, do you have any difficulty performing the following activities: 04/01/2020  Hearing? N  Vision? N  Difficulty concentrating or making decisions? N  Walking or climbing stairs? N  Dressing or bathing? N  Doing errands, shopping? N  Preparing Food and eating ? N  Using the Toilet? N  In the past six months, have you accidently leaked urine? N  Do you have problems with loss of bowel control? N  Managing your Medications? N  Managing your Finances? N  Housekeeping or managing your Housekeeping? N  Some recent data might be hidden    Patient Care Team: Tammi Sou, MD as PCP - General (Family Medicine) Jari Pigg, MD as Consulting Physician (Dermatology) Mauri Pole, MD as Consulting Physician (Gastroenterology)  Indicate any recent Medical Services you may have received from other than [facility] providers in the past year (date may be approximate).     Assessment:   This is a routine wellness examination for Dusti.  Hearing/Vision screen  Hearing Screening   125Hz  250Hz  500Hz  1000Hz  2000Hz  3000Hz  4000Hz  6000Hz  8000Hz   Right ear:           Left ear:           Comments: No issues  Vision Screening Comments: Wears glasses  Last eye exam-2021  Dietary issues and exercise activities discussed: Current Exercise Habits: The patient does not participate in regular exercise at present, Exercise  limited by: None identified  Goals    . Patient Stated     Maintain current health & increase activity      Depression Screen PHQ 2/9 Scores 04/01/2020 10/23/2018 01/29/2018 07/04/2016  PHQ - 2 Score 0 0 0 0    Fall Risk Fall Risk  04/01/2020 10/23/2018 01/29/2018 07/04/2016  Falls in the past year? 0 0 0 No  Number falls in past yr: 0 0 - -  Injury with Fall? 0 0 - -  Follow up Falls prevention discussed Falls evaluation completed - -    FALL RISK PREVENTION PERTAINING TO THE HOME:  Any stairs in or around the home? Yes  If so, are there any without handrails? No  Home free of loose throw rugs in walkways, pet beds, electrical cords, etc? Yes  Adequate lighting in your home to reduce risk of falls? Yes   ASSISTIVE DEVICES UTILIZED TO PREVENT FALLS:  Life alert? No  Use of a cane, walker or w/c? No  Grab bars in the bathroom? No  Shower chair or bench in shower? Yes  Elevated toilet seat or a handicapped toilet? No   TIMED UP AND GO:  Was the test performed? No . Phone visit   Cognitive Function:Normal cognitive status assessed by  this Nurse Health Advisor. No abnormalities found.          Immunizations Immunization History  Administered Date(s) Administered  . Fluad Quad(high Dose 65+) 10/23/2018  . Influenza Inj Mdck Quad Pf 10/26/2016  . Influenza,inj,Quad PF,6+ Mos 11/09/2015  . Influenza,inj,quad, With Preservative 11/09/2015  . Influenza-Unspecified 10/01/2017  . Moderna Sars-Covid-2 Vaccination 04/08/2019, 05/10/2019  . Pneumococcal Conjugate-13 01/29/2018  . Tdap 05/24/2017  . Zoster Recombinat (Shingrix) 05/24/2017, 10/30/2017    TDAP status: Up to date  Flu Vaccine status: Due, Education has been provided regarding the importance of this vaccine. Advised may receive this vaccine at local pharmacy or Health Dept. Aware to provide a copy of the vaccination record if obtained from local pharmacy or Health Dept. Verbalized acceptance and  understanding.  Pneumococcal vaccine status: Due, Education has been provided regarding the importance of this vaccine. Advised may receive this vaccine at local pharmacy or Health Dept. Aware to provide a copy of the vaccination record if obtained from local pharmacy or Health Dept. Verbalized acceptance and understanding.  Covid-19 vaccine status: Information provided on how to obtain vaccines.  Due for booster  Qualifies for Shingles Vaccine? No   Zostavax completed No   Shingrix Completed?: Yes  Screening Tests Health Maintenance  Topic Date Due  . MAMMOGRAM  09/01/2007  . DEXA SCAN  Never done  . PNA vac Low Risk Adult (2 of 2 - PPSV23) 01/30/2019  . INFLUENZA VACCINE  09/01/2019  . COVID-19 Vaccine (3 - Booster for Moderna series) 11/09/2019  . TETANUS/TDAP  05/25/2027  . COLONOSCOPY (Pts 45-81yrs Insurance coverage will need to be confirmed)  08/18/2027  . Hepatitis C Screening  Completed  . HPV VACCINES  Aged Out    Health Maintenance  Health Maintenance Due  Topic Date Due  . MAMMOGRAM  09/01/2007  . DEXA SCAN  Never done  . PNA vac Low Risk Adult (2 of 2 - PPSV23) 01/30/2019  . INFLUENZA VACCINE  09/01/2019  . COVID-19 Vaccine (3 - Booster for Moderna series) 11/09/2019    Colorectal cancer screening: Type of screening: Colonoscopy. Completed 08/17/2017. Repeat every 10 years  Mammogram status: Due-Patient decline today. She says she will have her GYN order.  Bone Density status: Patient declined.  Lung Cancer Screening: (Low Dose CT Chest recommended if Age 71-80 years, 30 pack-year currently smoking OR have quit w/in 15years.) does not qualify.    Additional Screening:  Hepatitis C Screening: Completed 05/24/2017  Vision Screening: Recommended annual ophthalmology exams for early detection of glaucoma and other disorders of the eye. Is the patient up to date with their annual eye exam?  Yes  Who is the provider or what is the name of the office in which the  patient attends annual eye exams? Patient unsure of name  Dental Screening: Recommended annual dental exams for proper oral hygiene  Community Resource Referral / Chronic Care Management: CRR required this visit?  No   CCM required this visit?  No      Plan:     I have personally reviewed and noted the following in the patient's chart:   . Medical and social history . Use of alcohol, tobacco or illicit drugs  . Current medications and supplements . Functional ability and status . Nutritional status . Physical activity . Advanced directives . List of other physicians . Hospitalizations, surgeries, and ER visits in previous 12 months . Vitals . Screenings to include cognitive, depression, and falls . Referrals and appointments  In addition, I have reviewed and discussed with patient certain preventive protocols, quality metrics, and best practice recommendations. A written personalized care plan for preventive services as well as general preventive health recommendations were provided to patient.   Due to this being a telephonic visit, the after visit summary with patients personalized plan was offered to patient via mail or my-chart. Patient preferred to pick up at office at next visit.    Marta Antu, LPN   02/02/7515  Nurse Health Advisor  Nurse Notes: None

## 2020-04-01 ENCOUNTER — Encounter: Payer: Self-pay | Admitting: Family Medicine

## 2020-04-01 ENCOUNTER — Ambulatory Visit (INDEPENDENT_AMBULATORY_CARE_PROVIDER_SITE_OTHER): Payer: Medicare PPO

## 2020-04-01 ENCOUNTER — Telehealth: Payer: Self-pay

## 2020-04-01 ENCOUNTER — Ambulatory Visit (INDEPENDENT_AMBULATORY_CARE_PROVIDER_SITE_OTHER): Payer: Medicare PPO | Admitting: Family Medicine

## 2020-04-01 VITALS — BP 156/76 | HR 84 | Temp 97.4°F | Resp 16 | Ht 63.0 in | Wt 125.6 lb

## 2020-04-01 VITALS — Ht 63.0 in | Wt 130.0 lb

## 2020-04-01 DIAGNOSIS — Z1231 Encounter for screening mammogram for malignant neoplasm of breast: Secondary | ICD-10-CM

## 2020-04-01 DIAGNOSIS — F17200 Nicotine dependence, unspecified, uncomplicated: Secondary | ICD-10-CM | POA: Diagnosis not present

## 2020-04-01 DIAGNOSIS — E2839 Other primary ovarian failure: Secondary | ICD-10-CM

## 2020-04-01 DIAGNOSIS — Z23 Encounter for immunization: Secondary | ICD-10-CM | POA: Diagnosis not present

## 2020-04-01 DIAGNOSIS — F411 Generalized anxiety disorder: Secondary | ICD-10-CM

## 2020-04-01 DIAGNOSIS — Z862 Personal history of diseases of the blood and blood-forming organs and certain disorders involving the immune mechanism: Secondary | ICD-10-CM | POA: Diagnosis not present

## 2020-04-01 DIAGNOSIS — Z0000 Encounter for general adult medical examination without abnormal findings: Secondary | ICD-10-CM

## 2020-04-01 LAB — CBC WITH DIFFERENTIAL/PLATELET
Basophils Absolute: 0 10*3/uL (ref 0.0–0.1)
Basophils Relative: 0.4 % (ref 0.0–3.0)
Eosinophils Absolute: 0.1 10*3/uL (ref 0.0–0.7)
Eosinophils Relative: 1.9 % (ref 0.0–5.0)
HCT: 43.6 % (ref 36.0–46.0)
Hemoglobin: 14.5 g/dL (ref 12.0–15.0)
Lymphocytes Relative: 25.8 % (ref 12.0–46.0)
Lymphs Abs: 2.1 10*3/uL (ref 0.7–4.0)
MCHC: 33.3 g/dL (ref 30.0–36.0)
MCV: 92.9 fl (ref 78.0–100.0)
Monocytes Absolute: 0.5 10*3/uL (ref 0.1–1.0)
Monocytes Relative: 6.9 % (ref 3.0–12.0)
Neutro Abs: 5.2 10*3/uL (ref 1.4–7.7)
Neutrophils Relative %: 65 % (ref 43.0–77.0)
Platelets: 274 10*3/uL (ref 150.0–400.0)
RBC: 4.7 Mil/uL (ref 3.87–5.11)
RDW: 13.7 % (ref 11.5–15.5)
WBC: 8 10*3/uL (ref 4.0–10.5)

## 2020-04-01 LAB — LIPID PANEL
Cholesterol: 179 mg/dL (ref 0–200)
HDL: 91 mg/dL (ref >39.00)
LDL Cholesterol: 67 mg/dL (ref 0–99)
NonHDL: 88.49
Total CHOL/HDL Ratio: 2
Triglycerides: 107 mg/dL (ref 0.0–149.0)
VLDL: 21.4 mg/dL (ref 0.0–40.0)

## 2020-04-01 LAB — COMPREHENSIVE METABOLIC PANEL
ALT: 18 U/L (ref 0–35)
AST: 20 U/L (ref 0–37)
Albumin: 4.7 g/dL (ref 3.5–5.2)
Alkaline Phosphatase: 72 U/L (ref 39–117)
BUN: 16 mg/dL (ref 6–23)
CO2: 33 mEq/L — ABNORMAL HIGH (ref 19–32)
Calcium: 10.3 mg/dL (ref 8.4–10.5)
Chloride: 101 mEq/L (ref 96–112)
Creatinine, Ser: 0.72 mg/dL (ref 0.40–1.20)
GFR: 86.67 mL/min (ref >60.00)
Glucose, Bld: 87 mg/dL (ref 70–99)
Potassium: 4.2 mEq/L (ref 3.5–5.1)
Sodium: 139 mEq/L (ref 135–145)
Total Bilirubin: 0.4 mg/dL (ref 0.2–1.2)
Total Protein: 7.1 g/dL (ref 6.0–8.3)

## 2020-04-01 MED ORDER — PANTOPRAZOLE SODIUM 40 MG PO TBEC: DELAYED_RELEASE_TABLET | ORAL | 3 refills | Status: DC

## 2020-04-01 MED ORDER — ALPRAZOLAM 0.5 MG PO TABS: ORAL_TABLET | ORAL | 1 refills | Status: DC

## 2020-04-01 MED ORDER — BUPROPION HCL ER (XL) 150 MG PO TB24: 150.0000 mg | ORAL_TABLET | Freq: Every day | ORAL | 1 refills | Status: DC

## 2020-04-01 NOTE — Addendum Note (Signed)
Addended by: Deveron Furlong D on: 04/01/2020 02:13 PM   Modules accepted: Orders

## 2020-04-01 NOTE — Patient Instructions (Signed)
Health Maintenance, Female Adopting a healthy lifestyle and getting preventive care are important in promoting health and wellness. Ask your health care provider about:  The right schedule for you to have regular tests and exams.  Things you can do on your own to prevent diseases and keep yourself healthy. What should I know about diet, weight, and exercise? Eat a healthy diet  Eat a diet that includes plenty of vegetables, fruits, low-fat dairy products, and lean protein.  Do not eat a lot of foods that are high in solid fats, added sugars, or sodium.   Maintain a healthy weight Body mass index (BMI) is used to identify weight problems. It estimates body fat based on height and weight. Your health care provider can help determine your BMI and help you achieve or maintain a healthy weight. Get regular exercise Get regular exercise. This is one of the most important things you can do for your health. Most adults should:  Exercise for at least 150 minutes each week. The exercise should increase your heart rate and make you sweat (moderate-intensity exercise).  Do strengthening exercises at least twice a week. This is in addition to the moderate-intensity exercise.  Spend less time sitting. Even light physical activity can be beneficial. Watch cholesterol and blood lipids Have your blood tested for lipids and cholesterol at 68 years of age, then have this test every 5 years. Have your cholesterol levels checked more often if:  Your lipid or cholesterol levels are high.  You are older than 68 years of age.  You are at high risk for heart disease. What should I know about cancer screening? Depending on your health history and family history, you may need to have cancer screening at various ages. This may include screening for:  Breast cancer.  Cervical cancer.  Colorectal cancer.  Skin cancer.  Lung cancer. What should I know about heart disease, diabetes, and high blood  pressure? Blood pressure and heart disease  High blood pressure causes heart disease and increases the risk of stroke. This is more likely to develop in people who have high blood pressure readings, are of African descent, or are overweight.  Have your blood pressure checked: ? Every 3-5 years if you are 18-39 years of age. ? Every year if you are 40 years old or older. Diabetes Have regular diabetes screenings. This checks your fasting blood sugar level. Have the screening done:  Once every three years after age 40 if you are at a normal weight and have a low risk for diabetes.  More often and at a younger age if you are overweight or have a high risk for diabetes. What should I know about preventing infection? Hepatitis B If you have a higher risk for hepatitis B, you should be screened for this virus. Talk with your health care provider to find out if you are at risk for hepatitis B infection. Hepatitis C Testing is recommended for:  Everyone born from 1945 through 1965.  Anyone with known risk factors for hepatitis C. Sexually transmitted infections (STIs)  Get screened for STIs, including gonorrhea and chlamydia, if: ? You are sexually active and are younger than 68 years of age. ? You are older than 68 years of age and your health care provider tells you that you are at risk for this type of infection. ? Your sexual activity has changed since you were last screened, and you are at increased risk for chlamydia or gonorrhea. Ask your health care provider   if you are at risk.  Ask your health care provider about whether you are at high risk for HIV. Your health care provider may recommend a prescription medicine to help prevent HIV infection. If you choose to take medicine to prevent HIV, you should first get tested for HIV. You should then be tested every 3 months for as long as you are taking the medicine. Pregnancy  If you are about to stop having your period (premenopausal) and  you may become pregnant, seek counseling before you get pregnant.  Take 400 to 800 micrograms (mcg) of folic acid every day if you become pregnant.  Ask for birth control (contraception) if you want to prevent pregnancy. Osteoporosis and menopause Osteoporosis is a disease in which the bones lose minerals and strength with aging. This can result in bone fractures. If you are 65 years old or older, or if you are at risk for osteoporosis and fractures, ask your health care provider if you should:  Be screened for bone loss.  Take a calcium or vitamin D supplement to lower your risk of fractures.  Be given hormone replacement therapy (HRT) to treat symptoms of menopause. Follow these instructions at home: Lifestyle  Do not use any products that contain nicotine or tobacco, such as cigarettes, e-cigarettes, and chewing tobacco. If you need help quitting, ask your health care provider.  Do not use street drugs.  Do not share needles.  Ask your health care provider for help if you need support or information about quitting drugs. Alcohol use  Do not drink alcohol if: ? Your health care provider tells you not to drink. ? You are pregnant, may be pregnant, or are planning to become pregnant.  If you drink alcohol: ? Limit how much you use to 0-1 drink a day. ? Limit intake if you are breastfeeding.  Be aware of how much alcohol is in your drink. In the [place] one drink equals one 12 oz bottle of beer (355 mL), one 5 oz glass of wine (148 mL), or one 1 oz glass of hard liquor (44 mL). General instructions  Schedule regular health, dental, and eye exams.  Stay current with your vaccines.  Tell your health care provider if: ? You often feel depressed. ? You have ever been abused or do not feel safe at home. Summary  Adopting a healthy lifestyle and getting preventive care are important in promoting health and wellness.  Follow your health care provider's instructions about healthy  diet, exercising, and getting tested or screened for diseases.  Follow your health care provider's instructions on monitoring your cholesterol and blood pressure. This information is not intended to replace advice given to you by your health care provider. Make sure you discuss any questions you have with your health care provider. Document Revised: 01/10/2018 Document Reviewed: 01/10/2018 Elsevier Patient Education  2021 [employer]  

## 2020-04-01 NOTE — Patient Instructions (Signed)
Linda Davies , Thank you for taking time to complete your Medicare Wellness Visit. I appreciate your ongoing commitment to your health goals. Please review the following plan we discussed and let me know if I can assist you in the future.   Screening recommendations/referrals: Colonoscopy: Completed 08/17/2017-Due-08/18/2027 Mammogram: Declined today.Per our conversation today, you will see GYN to schedule. Bone Density: Declined today. Please call the office to schedule if you change your mind. Recommended yearly ophthalmology/optometry visit for glaucoma screening and checkup Recommended yearly dental visit for hygiene and checkup  Vaccinations: Influenza vaccine: Due-May obtain vaccine at our office or your local pharmacy. Pneumococcal vaccine: Due for Pneumovax-23 -May obtain vaccine at our office or your local pharmacy. Tdap vaccine: Up to date-Due-05/25/2027 Shingles vaccine:  Completed vaccines  Covid-19:Completed vaccines  Advanced directives: Please bring a copy for your chart  Conditions/risks identified: See problem list  Next appointment: Follow up in one year for your annual wellness visit   Preventive Care 65 Years and Older, Female Preventive care refers to lifestyle choices and visits with your health care provider that can promote health and wellness. What does preventive care include?  A yearly physical exam. This is also called an annual well check.  Dental exams once or twice a year.  Routine eye exams. Ask your health care provider how often you should have your eyes checked.  Personal lifestyle choices, including:  Daily care of your teeth and gums.  Regular physical activity.  Eating a healthy diet.  Avoiding tobacco and drug use.  Limiting alcohol use.  Practicing safe sex.  Taking low-dose aspirin every day.  Taking vitamin and mineral supplements as recommended by your health care provider. What happens during an annual well check? The services  and screenings done by your health care provider during your annual well check will depend on your age, overall health, lifestyle risk factors, and family history of disease. Counseling  Your health care provider may ask you questions about your:  Alcohol use.  Tobacco use.  Drug use.  Emotional well-being.  Home and relationship well-being.  Sexual activity.  Eating habits.  History of falls.  Memory and ability to understand (cognition).  Work and work Statistician.  Reproductive health. Screening  You may have the following tests or measurements:  Height, weight, and BMI.  Blood pressure.  Lipid and cholesterol levels. These may be checked every 5 years, or more frequently if you are over 72 years old.  Skin check.  Lung cancer screening. You may have this screening every year starting at age 46 if you have a 30-pack-year history of smoking and currently smoke or have quit within the past 15 years.  Fecal occult blood test (FOBT) of the stool. You may have this test every year starting at age 48.  Flexible sigmoidoscopy or colonoscopy. You may have a sigmoidoscopy every 5 years or a colonoscopy every 10 years starting at age 73.  Hepatitis C blood test.  Hepatitis B blood test.  Sexually transmitted disease (STD) testing.  Diabetes screening. This is done by checking your blood sugar (glucose) after you have not eaten for a while (fasting). You may have this done every 1-3 years.  Bone density scan. This is done to screen for osteoporosis. You may have this done starting at age 59.  Mammogram. This may be done every 1-2 years. Talk to your health care provider about how often you should have regular mammograms. Talk with your health care provider about your test results,  treatment options, and if necessary, the need for more tests. Vaccines  Your health care provider may recommend certain vaccines, such as:  Influenza vaccine. This is recommended every  year.  Tetanus, diphtheria, and acellular pertussis (Tdap, Td) vaccine. You may need a Td booster every 10 years.  Zoster vaccine. You may need this after age 69.  Pneumococcal 13-valent conjugate (PCV13) vaccine. One dose is recommended after age 27.  Pneumococcal polysaccharide (PPSV23) vaccine. One dose is recommended after age 59. Talk to your health care provider about which screenings and vaccines you need and how often you need them. This information is not intended to replace advice given to you by your health care provider. Make sure you discuss any questions you have with your health care provider. Document Released: 02/13/2015 Document Revised: 10/07/2015 Document Reviewed: 11/18/2014 Elsevier Interactive Patient Education  2017 [place] Prevention in the Home Falls can cause injuries. They can happen to people of all ages. There are many things you can do to make your home safe and to help prevent falls. What can I do on the outside of my home?  Regularly fix the edges of walkways and driveways and fix any cracks.  Remove anything that might make you trip as you walk through a door, such as a raised step or threshold.  Trim any bushes or trees on the path to your home.  Use bright outdoor lighting.  Clear any walking paths of anything that might make someone trip, such as rocks or tools.  Regularly check to see if handrails are loose or broken. Make sure that both sides of any steps have handrails.  Any raised decks and porches should have guardrails on the edges.  Have any leaves, snow, or ice cleared regularly.  Use sand or salt on walking paths during winter.  Clean up any spills in your garage right away. This includes oil or grease spills. What can I do in the bathroom?  Use night lights.  Install grab bars by the toilet and in the tub and shower. Do not use towel bars as grab bars.  Use non-skid mats or decals in the tub or shower.  If you  need to sit down in the shower, use a plastic, non-slip stool.  Keep the floor dry. Clean up any water that spills on the floor as soon as it happens.  Remove soap buildup in the tub or shower regularly.  Attach bath mats securely with double-sided non-slip rug tape.  Do not have throw rugs and other things on the floor that can make you trip. What can I do in the bedroom?  Use night lights.  Make sure that you have a light by your bed that is easy to reach.  Do not use any sheets or blankets that are too big for your bed. They should not hang down onto the floor.  Have a firm chair that has side arms. You can use this for support while you get dressed.  Do not have throw rugs and other things on the floor that can make you trip. What can I do in the kitchen?  Clean up any spills right away.  Avoid walking on wet floors.  Keep items that you use a lot in easy-to-reach places.  If you need to reach something above you, use a strong step stool that has a grab bar.  Keep electrical cords out of the way.  Do not use floor polish or wax that makes floors  slippery. If you must use wax, use non-skid floor wax.  Do not have throw rugs and other things on the floor that can make you trip. What can I do with my stairs?  Do not leave any items on the stairs.  Make sure that there are handrails on both sides of the stairs and use them. Fix handrails that are broken or loose. Make sure that handrails are as long as the stairways.  Check any carpeting to make sure that it is firmly attached to the stairs. Fix any carpet that is loose or worn.  Avoid having throw rugs at the top or bottom of the stairs. If you do have throw rugs, attach them to the floor with carpet tape.  Make sure that you have a light switch at the top of the stairs and the bottom of the stairs. If you do not have them, ask someone to add them for you. What else can I do to help prevent falls?  Wear shoes  that:  Do not have high heels.  Have rubber bottoms.  Are comfortable and fit you well.  Are closed at the toe. Do not wear sandals.  If you use a stepladder:  Make sure that it is fully opened. Do not climb a closed stepladder.  Make sure that both sides of the stepladder are locked into place.  Ask someone to hold it for you, if possible.  Clearly mark and make sure that you can see:  Any grab bars or handrails.  First and last steps.  Where the edge of each step is.  Use tools that help you move around (mobility aids) if they are needed. These include:  Canes.  Walkers.  Scooters.  Crutches.  Turn on the lights when you go into a dark area. Replace any light bulbs as soon as they burn out.  Set up your furniture so you have a clear path. Avoid moving your furniture around.  If any of your floors are uneven, fix them.  If there are any pets around you, be aware of where they are.  Review your medicines with your doctor. Some medicines can make you feel dizzy. This can increase your chance of falling. Ask your doctor what other things that you can do to help prevent falls. This information is not intended to replace advice given to you by your health care provider. Make sure you discuss any questions you have with your health care provider. Document Released: 11/13/2008 Document Revised: 06/25/2015 Document Reviewed: 02/21/2014 Elsevier Interactive Patient Education  2017 [employer].

## 2020-04-01 NOTE — Telephone Encounter (Signed)
Patient notified that her after visit summary was accidentally handed to another patient and that the other patient returned it, in its entirety to the front desk staff. Patient  had no questions or concerns.

## 2020-04-01 NOTE — Progress Notes (Signed)
Office Note 04/01/2020  CC:  Chief Complaint  Patient presents with  . Annual Exam    Pt is not fasting   HPI:  Linda Davies is a 70 y.o. female who is here for annual health maintenance exam and f/u anxiety. A/P as of last visit: "1) GAD with anxiety-induced insomnia. The current medical regimen is effective;  continue present plan and medications. Will renew [place] and UDS at next f/u in 35mo for her CPE. I eRx'd alpraz 0.5, #135, 90d supply, with 1 RF--today.  2) Hx of gastritis with IDA (but no convincing site of bleeding to explain IDA, plus her hemoccults were neg): she has done well since 2019. No longer on iron supplement. Plan on CBC recheck at next f/u in 6 mo. Continue daily protonix 40mg  qd and iron-rich diet.  3) Tobacco dependence: encouraged complete cessation but she is hanging on to this habit, wants to continue to slowly cut back and use nicotine replacement prn."  INTERIM HX: Doing okay, cleans house 2 hrs a day and takes a daily walk for exercise. Still smoking cigs. She reports having covid infection 02/2020.  Taking pantoprazole 40mg  qd and denies GER or abd pain. Takes Calcium and vit D supplements.   Still getting good benefit from alpraz 0.5mg  bid.   PMP AWARE reviewed today: most recent rx for alpraz 0.5mg  was filled 03/15/20, # 15, rx by me. Prior to that she had been getting #60 filled every month. No red flags.  Past Medical History:  Diagnosis Date  . Allergic rhinitis   . Anxiety   . Arthritis    low back and both hands  . COPD (chronic obstructive pulmonary disease) ([place])    asymptomatic BUT has purse-lipped breathing on exam even when feeling well  . Gastritis 08/17/2017   EGD  . Iron deficiency anemia 05/2017     Hemoccults neg 05/26/17.  Colonoscopy -->no culprit.  EGD--> gastritis (h pyl NEG).  . Melanoma ([place])    Near umbilicus. close f/u with Dr. Delman Cheadle (annual)  . Tobacco dependence     Past Surgical History:  Procedure  Laterality Date  . CESAREAN SECTION    . COLONOSCOPY  08/17/2017   Done for unexplained IDA-->Diverticulosis, non-bleeding internal hem, o/w normal.  Repeat 10 yrs.  . ESOPHAGOGASTRODUODENOSCOPY  08/17/2017   +Gastritis.  Gastric bx "reactive gastritis".  Bx for H pylori NEG.        Duodenum bx: NORMAL.  Marland Kitchen EYE SURGERY    . MELANOMA EXCISION      Family History  Problem Relation Age of Onset  . Arthritis Mother   . Asthma Mother   . COPD Mother   . Hypertension Mother   . Cancer Father        liver ca  . Alcohol abuse Father   . Alzheimer's disease Father   . Cancer Brother        Leukemia 2017  . Hearing loss Brother   . Early death Brother   . Alcohol abuse Maternal Grandmother   . Cancer Maternal Grandmother   . Cancer Maternal Grandfather        lung  . Alzheimer's disease Paternal Grandmother   . Cancer Paternal Grandfather        lung  . Asthma Daughter     Social History   Socioeconomic History  . Marital status: Married    Spouse name: Not on file  . Number of children: Not on file  . Years of education: Not  on file  . Highest education level: Not on file  Occupational History  . Occupation: Teacher, early years/pre  Tobacco Use  . Smoking status: Current Every Day Smoker    Packs/day: 0.50    Years: 30.00    Pack years: 15.00    Types: Cigarettes  . Smokeless tobacco: Never Used  Vaping Use  . Vaping Use: Never used  Substance and Sexual Activity  . Alcohol use: Yes    Alcohol/week: 2.0 standard drinks    Types: 2 Glasses of wine per week    Comment: white wine every evening  . Drug use: Never  . Sexual activity: Yes    Partners: Male  Other Topics Concern  . Not on file  Social History Narrative   Married (her pt is a husband of mine as well), 3 children, 1 grand child.   Educ: college.   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   Tob: current as of 05/2017; 15 pack-yr hx.   VQQ:[id number]   Social Determinants of Health   Financial  Resource Strain: Low Risk   . Difficulty of Paying Living Expenses: Not hard at all  Food Insecurity: No Food Insecurity  . Worried About Charity fundraiser in the Last Year: Never true  . Ran Out of Food in the Last Year: Never true  Transportation Needs: No Transportation Needs  . Lack of Transportation (Medical): No  . Lack of Transportation (Non-Medical): No  Physical Activity: Inactive  . Days of Exercise per Week: 0 days  . Minutes of Exercise per Session: 0 min  Stress: No Stress Concern Present  . Feeling of Stress : Not at all  Social Connections: Moderately Integrated  . Frequency of Communication with Friends and Family: More than three times a week  . Frequency of Social Gatherings with Friends and Family: More than three times a week  . Attends Religious Services: 1 to 4 times per year  . Active Member of Clubs or Organizations: No  . Attends Archivist Meetings: Never  . Marital Status: Married  Human resources officer Violence: Not At Risk  . Fear of Current or Ex-Partner: No  . Emotionally Abused: No  . Physically Abused: No  . Sexually Abused: No    Outpatient Medications Prior to Visit  Medication Sig Dispense Refill  . cholecalciferol (VITAMIN D) 1000 units tablet Take 1,000 Units by mouth daily.    . Multiple Vitamin (MULTIVITAMIN) tablet Take 1 tablet by mouth daily.    . vitamin C (ASCORBIC ACID) 500 MG tablet Take 500 mg by mouth daily.    Marland Kitchen ALPRAZolam (XANAX) 0.5 MG tablet TAKE 1/2 TO 1 TABLET BY MOUTH TWICE DAILY AS NEEDED FOR ANXIETY 135 tablet 0  . pantoprazole (PROTONIX) 40 MG tablet TAKE 1 TABLET(40 MG) BY MOUTH DAILY 30 tablet 0   No facility-administered medications prior to visit.    Allergies  Allergen Reactions  . Latex Rash    ROS Review of Systems  Constitutional: Negative for appetite change, chills, fatigue and fever.  HENT: Negative for congestion, dental problem, ear pain and sore throat.   Eyes: Negative for discharge,  redness and visual disturbance.  Respiratory: Negative for cough, chest tightness, shortness of breath and wheezing.   Cardiovascular: Negative for chest pain, palpitations and leg swelling.  Gastrointestinal: Negative for abdominal pain, blood in stool, diarrhea, nausea and vomiting.  Genitourinary: Negative for difficulty urinating, dysuria, flank pain, frequency, hematuria and urgency.  Musculoskeletal: Negative for arthralgias, back pain,  joint swelling, myalgias and neck stiffness.  Skin: Negative for pallor and rash.  Neurological: Negative for dizziness, speech difficulty, weakness and headaches.  Hematological: Negative for adenopathy. Does not bruise/bleed easily.  Psychiatric/Behavioral: Negative for confusion and sleep disturbance. The patient is not nervous/anxious.     PE; Vitals with BMI 04/01/2020 04/01/2020 07/22/2019  Height 5\' 3"  5\' 3"  5\' 3"   Weight 125 lbs 10 oz 130 lbs 130 lbs 8 oz  BMI 22.25 21.30 86.57  Systolic 846 - 962  Diastolic 76 - 82  Pulse 84 - 87    Exam chaperoned by Deveron Furlong, CMA.  Gen: Alert, well appearing.  Patient is oriented to person, place, time, and situation. AFFECT: pleasant, lucid thought and speech. ENT: Ears: EACs clear, normal epithelium.  TMs with good light reflex and landmarks bilaterally.  Eyes: no injection, icteris, swelling, or exudate.  EOMI, PERRLA. Nose: no drainage or turbinate edema/swelling.  No injection or focal lesion.  Mouth: lips without lesion/swelling.  Oral mucosa pink and moist.  Dentition intact and without obvious caries or gingival swelling.  Oropharynx without erythema, exudate, or swelling.  Neck: supple/nontender.  No LAD, mass, or TM.  Carotid pulses 2+ bilaterally, without bruits. CV: RRR, no m/r/g.   LUNGS: CTA bilat, nonlabored resps, good aeration in all lung fields. ABD: soft, NT, ND, BS normal.  No hepatospenomegaly or mass.  No bruits. EXT: no clubbing, cyanosis, or edema.  Musculoskeletal: no joint  swelling, erythema, warmth, or tenderness.  ROM of all joints intact. Skin - no sores or suspicious lesions or rashes or color changes   Pertinent labs:  Lab Results  Component Value Date   TSH 1.56 10/23/2018   Lab Results  Component Value Date   WBC 7.4 10/23/2018   HGB 15.0 10/23/2018   HCT 44.8 10/23/2018   MCV 93.5 10/23/2018   PLT 249.0 10/23/2018   Lab Results  Component Value Date   CREATININE 0.69 10/23/2018   BUN 14 10/23/2018   NA 141 10/23/2018   K 4.8 10/23/2018   CL 103 10/23/2018   CO2 32 10/23/2018   Lab Results  Component Value Date   ALT 18 10/23/2018   AST 19 10/23/2018   ALKPHOS 63 10/23/2018   BILITOT 0.6 10/23/2018   Lab Results  Component Value Date   CHOL 177 10/23/2018   Lab Results  Component Value Date   HDL 79.90 10/23/2018   Lab Results  Component Value Date   LDLCALC 81 10/23/2018   Lab Results  Component Value Date   TRIG 76.0 10/23/2018   Lab Results  Component Value Date   CHOLHDL 2 10/23/2018   ASSESSMENT AND PLAN:   1) GAD: cont alpraz 0.5mg  bid. CSC renewed today. Sent in rx for alpraz 0.5mg , 1 bid prn, #180, RF x 1.  2) Tobacco dependence: she declined lung cancer screening offered today. She is fearful of potential psych side effects of chantix but was agreeable to trial of wellbutrin xl 150mg  qd.   3) Hx of gastritis with IDA (but no convincing site of bleeding to explain IDA, plus her hemoccults were neg): she has done well since 2019. No longer on iron supplement. Continue daily protonix 40mg  qd and iron-rich diet. CBC today.  4) Health maintenance exam: Reviewed age and gender appropriate health maintenance issues (prudent diet, regular exercise, health risks of tobacco and excessive alcohol, use of seatbelts, fire alarms in home, use of sunscreen).  Also reviewed age and gender appropriate health screening as  well as vaccine recommendations. Vaccines: Pneumovax 23->given today.  Flu vaccine->given today.   Otherwise ALL vaccines UTD. Labs: fasting HP labs ordered->she is fasting. Cervical ca screening: via her GYN Breast ca screening: mammogram->via her GYN. Colon ca screening: next colonoscopy 2029. Osteoporosis screening: via her GYN.  An After Visit Summary was printed and given to the patient.  FOLLOW UP:  Return in about 4 weeks (around 04/29/2020) for f/u smoking cessation.  Signed:  [name], MD           04/01/2020

## 2020-04-02 LAB — TSH: TSH: 1.26 u[IU]/mL (ref 0.35–4.50)

## 2020-04-21 ENCOUNTER — Other Ambulatory Visit: Payer: Self-pay | Admitting: Family Medicine

## 2020-10-06 ENCOUNTER — Other Ambulatory Visit: Payer: Self-pay | Admitting: Family Medicine

## 2020-10-20 ENCOUNTER — Other Ambulatory Visit: Payer: Self-pay | Admitting: Family Medicine

## 2020-10-22 NOTE — Telephone Encounter (Signed)
Will do 30d supply. Pt due for f/u.

## 2020-10-22 NOTE — Telephone Encounter (Signed)
Requesting: alprazolam Contract: 04/01/20 UDS:10/23/18 Last Visit:04/01/20 Next Visit:advised to f/u 4 weeks Last Refill:04/01/20(180,1)  Please review and advise, med pending

## 2020-10-22 NOTE — Telephone Encounter (Signed)
LM for pt to return call regarding refill

## 2020-10-22 NOTE — Telephone Encounter (Signed)
Spoke with pt to advise rx sent and appt scheduled for f/u

## 2020-10-31 DIAGNOSIS — Z8781 Personal history of (healed) traumatic fracture: Secondary | ICD-10-CM

## 2020-10-31 HISTORY — DX: Personal history of (healed) traumatic fracture: Z87.81

## 2020-11-03 ENCOUNTER — Encounter: Payer: Self-pay | Admitting: Family Medicine

## 2020-11-03 ENCOUNTER — Other Ambulatory Visit: Payer: Self-pay

## 2020-11-03 ENCOUNTER — Ambulatory Visit: Payer: Medicare PPO | Admitting: Family Medicine

## 2020-11-03 VITALS — BP 139/79 | HR 86 | Temp 97.9°F | Ht 63.0 in | Wt 133.0 lb

## 2020-11-03 DIAGNOSIS — Z8639 Personal history of other endocrine, nutritional and metabolic disease: Secondary | ICD-10-CM | POA: Diagnosis not present

## 2020-11-03 DIAGNOSIS — Z79899 Other long term (current) drug therapy: Secondary | ICD-10-CM | POA: Diagnosis not present

## 2020-11-03 DIAGNOSIS — F17200 Nicotine dependence, unspecified, uncomplicated: Secondary | ICD-10-CM | POA: Diagnosis not present

## 2020-11-03 DIAGNOSIS — F411 Generalized anxiety disorder: Secondary | ICD-10-CM

## 2020-11-03 DIAGNOSIS — Z23 Encounter for immunization: Secondary | ICD-10-CM | POA: Diagnosis not present

## 2020-11-03 DIAGNOSIS — Z8719 Personal history of other diseases of the digestive system: Secondary | ICD-10-CM

## 2020-11-03 MED ORDER — ALPRAZOLAM 0.5 MG PO TABS: ORAL_TABLET | ORAL | 5 refills | Status: DC
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
## 2020-11-06 LAB — DRUG MONITORING PANEL 376104, URINE

## 2020-11-06 LAB — DM TEMPLATE

## 2020-11-19 ENCOUNTER — Other Ambulatory Visit: Payer: Self-pay | Admitting: Family Medicine

## 2020-11-20 ENCOUNTER — Other Ambulatory Visit: Payer: Self-pay | Admitting: Family Medicine

## 2020-11-24 DIAGNOSIS — S7001XA Contusion of right hip, initial encounter: Secondary | ICD-10-CM | POA: Diagnosis not present

## 2020-11-25 ENCOUNTER — Encounter (HOSPITAL_COMMUNITY): Payer: Self-pay | Admitting: Orthopedic Surgery

## 2020-11-25 DIAGNOSIS — S72001A Fracture of unspecified part of neck of right femur, initial encounter for closed fracture: Secondary | ICD-10-CM | POA: Diagnosis present

## 2020-11-25 DIAGNOSIS — M25551 Pain in right hip: Secondary | ICD-10-CM | POA: Diagnosis not present

## 2020-11-25 NOTE — Progress Notes (Signed)
Patient surgery was changed from Ambulatory Surgery to Surgery Admit.  Patient will need covid testing on DOS.  Patient informed to arrive at 10:30 am.  Patient verbalized understanding.

## 2020-11-25 NOTE — Progress Notes (Signed)
DUE TO COVID-19 ONLY ONE VISITOR IS ALLOWED TO COME WITH YOU AND STAY IN THE WAITING ROOM ONLY DURING PRE OP AND PROCEDURE DAY OF SURGERY.   PCP - Dr Shawnie Dapper Cardiologist - n/a  Chest x-ray - n/a EKG - n/a Stress Test - n/a ECHO - n/a Cardiac Cath - n/a  ICD Pacemaker/Loop - n/a  Sleep Study -  n/a CPAP - none  Diabetes - n/a  ERAS: Clear Liquids til 10:30 am DOS.  Clear liquids reviewed with patient.  STOP now taking any Aspirin (unless otherwise instructed by your surgeon), Aleve, Naproxen, Ibuprofen, Motrin, Advil, Goody's, BC's, all herbal medications, fish oil, and all vitamins.   Coronavirus Screening Covid test n/a Ambulatory Surgery  Do you have any of the following symptoms:  Cough yes/no: No Fever (>100.48F)  yes/no: No Runny nose yes/no: No Sore throat yes/no: No Difficulty breathing/shortness of breath  yes/no: No  Have you traveled in the last 14 days and where? yes/no: No  Patient verbalized understanding of instructions that were given via phone.

## 2020-11-26 ENCOUNTER — Encounter (HOSPITAL_COMMUNITY): Payer: Self-pay | Admitting: Orthopedic Surgery

## 2020-11-26 ENCOUNTER — Inpatient Hospital Stay (HOSPITAL_COMMUNITY): Payer: Medicare PPO | Admitting: Certified Registered Nurse Anesthetist

## 2020-11-26 ENCOUNTER — Inpatient Hospital Stay (HOSPITAL_COMMUNITY)
Admission: RE | Admit: 2020-11-26 | Discharge: 2020-11-26 | DRG: 482 | Disposition: A | Discharge status: Home / Self Care | Payer: Medicare PPO | Attending: Orthopedic Surgery | Admitting: Orthopedic Surgery

## 2020-11-26 ENCOUNTER — Inpatient Hospital Stay (HOSPITAL_COMMUNITY): Payer: Medicare PPO

## 2020-11-26 ENCOUNTER — Encounter (HOSPITAL_COMMUNITY)
Admission: RE | Disposition: A | Discharge status: Home / Self Care | Payer: Self-pay | Source: Home / Self Care | Attending: Orthopedic Surgery

## 2020-11-26 DIAGNOSIS — Z8261 Family history of arthritis: Secondary | ICD-10-CM | POA: Diagnosis not present

## 2020-11-26 DIAGNOSIS — S72001A Fracture of unspecified part of neck of right femur, initial encounter for closed fracture: Secondary | ICD-10-CM | POA: Diagnosis not present

## 2020-11-26 DIAGNOSIS — S79911A Unspecified injury of right hip, initial encounter: Secondary | ICD-10-CM | POA: Diagnosis present

## 2020-11-26 DIAGNOSIS — Z20822 Contact with and (suspected) exposure to covid-19: Secondary | ICD-10-CM | POA: Diagnosis not present

## 2020-11-26 DIAGNOSIS — Z820 Family history of epilepsy and other diseases of the nervous system: Secondary | ICD-10-CM

## 2020-11-26 DIAGNOSIS — J449 Chronic obstructive pulmonary disease, unspecified: Secondary | ICD-10-CM | POA: Diagnosis present

## 2020-11-26 DIAGNOSIS — F419 Anxiety disorder, unspecified: Secondary | ICD-10-CM | POA: Diagnosis present

## 2020-11-26 DIAGNOSIS — M1990 Unspecified osteoarthritis, unspecified site: Secondary | ICD-10-CM | POA: Diagnosis present

## 2020-11-26 DIAGNOSIS — Z8249 Family history of ischemic heart disease and other diseases of the circulatory system: Secondary | ICD-10-CM | POA: Diagnosis not present

## 2020-11-26 DIAGNOSIS — Z419 Encounter for procedure for purposes other than remedying health state, unspecified: Secondary | ICD-10-CM

## 2020-11-26 DIAGNOSIS — Z825 Family history of asthma and other chronic lower respiratory diseases: Secondary | ICD-10-CM

## 2020-11-26 DIAGNOSIS — Z85820 Personal history of malignant melanoma of skin: Secondary | ICD-10-CM

## 2020-11-26 DIAGNOSIS — Z800 Family history of malignant neoplasm of digestive organs: Secondary | ICD-10-CM

## 2020-11-26 DIAGNOSIS — S7290XA Unspecified fracture of unspecified femur, initial encounter for closed fracture: Secondary | ICD-10-CM | POA: Diagnosis present

## 2020-11-26 DIAGNOSIS — K219 Gastro-esophageal reflux disease without esophagitis: Secondary | ICD-10-CM | POA: Diagnosis present

## 2020-11-26 DIAGNOSIS — Z811 Family history of alcohol abuse and dependence: Secondary | ICD-10-CM

## 2020-11-26 DIAGNOSIS — M80051A Age-related osteoporosis with current pathological fracture, right femur, initial encounter for fracture: Secondary | ICD-10-CM | POA: Diagnosis not present

## 2020-11-26 DIAGNOSIS — W19XXXA Unspecified fall, initial encounter: Secondary | ICD-10-CM | POA: Diagnosis present

## 2020-11-26 DIAGNOSIS — Z87891 Personal history of nicotine dependence: Secondary | ICD-10-CM | POA: Diagnosis not present

## 2020-11-26 HISTORY — PX: HIP PINNING,CANNULATED: SHX1758

## 2020-11-26 HISTORY — DX: Basal cell carcinoma of skin, unspecified: C44.91

## 2020-11-26 HISTORY — DX: Gastro-esophageal reflux disease without esophagitis: K21.9

## 2020-11-26 HISTORY — DX: Pneumonia, unspecified organism: J18.9

## 2020-11-26 LAB — CBC
HCT: 44.7 % (ref 36.0–46.0)
Hemoglobin: 14.9 g/dL (ref 12.0–15.0)
MCH: 31.6 pg (ref 26.0–34.0)
MCHC: 33.3 g/dL (ref 30.0–36.0)
MCV: 94.9 fL (ref 80.0–100.0)
Platelets: 239 10*3/uL (ref 150–400)
RBC: 4.71 MIL/uL (ref 3.87–5.11)
RDW: 11.9 % (ref 11.5–15.5)
WBC: 6.4 10*3/uL (ref 4.0–10.5)
nRBC: 0 % (ref 0.0–0.2)

## 2020-11-26 LAB — SARS CORONAVIRUS 2 BY RT PCR (HOSPITAL ORDER, PERFORMED IN ~~LOC~~ HOSPITAL LAB): SARS Coronavirus 2: NEGATIVE

## 2020-11-26 SURGERY — FIXATION, FEMUR, NECK, PERCUTANEOUS, USING SCREW
Anesthesia: General | Site: Hip | Laterality: Right

## 2020-11-26 MED ORDER — ROCURONIUM BROMIDE 10 MG/ML (PF) SYRINGE
PREFILLED_SYRINGE | INTRAVENOUS | Status: AC
  Filled 2020-11-26: qty 10

## 2020-11-26 MED ORDER — PHENYLEPHRINE HCL-NACL 20-0.9 MG/250ML-% IV SOLN
INTRAVENOUS | Status: DC | PRN
  Administered 2020-11-26: 20 ug/min via INTRAVENOUS

## 2020-11-26 MED ORDER — HYDROMORPHONE HCL 1 MG/ML IJ SOLN: 0.2500 mg | INTRAMUSCULAR | Status: DC | PRN

## 2020-11-26 MED ORDER — SUGAMMADEX SODIUM 200 MG/2ML IV SOLN
INTRAVENOUS | Status: DC | PRN
  Administered 2020-11-26: 200 mg via INTRAVENOUS

## 2020-11-26 MED ORDER — LACTATED RINGERS IV SOLN: INTRAVENOUS | Status: DC

## 2020-11-26 MED ORDER — LIDOCAINE 2% (20 MG/ML) 5 ML SYRINGE
INTRAMUSCULAR | Status: DC | PRN
  Administered 2020-11-26: 100 mg via INTRAVENOUS

## 2020-11-26 MED ORDER — POVIDONE-IODINE 7.5 % EX SOLN
Freq: Once | CUTANEOUS | Status: DC
  Filled 2020-11-26: qty 118

## 2020-11-26 MED ORDER — CEFAZOLIN SODIUM-DEXTROSE 2-4 GM/100ML-% IV SOLN
2.0000 g | INTRAVENOUS | Status: AC
  Administered 2020-11-26: 2 g via INTRAVENOUS
  Filled 2020-11-26: qty 100

## 2020-11-26 MED ORDER — ROCURONIUM BROMIDE 10 MG/ML (PF) SYRINGE
PREFILLED_SYRINGE | INTRAVENOUS | Status: DC | PRN
  Administered 2020-11-26: 30 mg via INTRAVENOUS
  Administered 2020-11-26: 20 mg via INTRAVENOUS

## 2020-11-26 MED ORDER — ONDANSETRON HCL 4 MG/2ML IJ SOLN
INTRAMUSCULAR | Status: DC | PRN
  Administered 2020-11-26: 4 mg via INTRAVENOUS

## 2020-11-26 MED ORDER — PROPOFOL 10 MG/ML IV BOLUS
INTRAVENOUS | Status: AC
  Filled 2020-11-26: qty 20

## 2020-11-26 MED ORDER — 0.9 % SODIUM CHLORIDE (POUR BTL) OPTIME
TOPICAL | Status: DC | PRN
  Administered 2020-11-26: 1000 mL

## 2020-11-26 MED ORDER — ASPIRIN EC 325 MG PO TBEC: 325.0000 mg | DELAYED_RELEASE_TABLET | Freq: Two times a day (BID) | ORAL | 0 refills | Status: DC

## 2020-11-26 MED ORDER — FENTANYL CITRATE (PF) 250 MCG/5ML IJ SOLN
INTRAMUSCULAR | Status: DC | PRN
  Administered 2020-11-26: 100 ug via INTRAVENOUS
  Administered 2020-11-26: 50 ug via INTRAVENOUS

## 2020-11-26 MED ORDER — PHENYLEPHRINE HCL (PRESSORS) 10 MG/ML IV SOLN
INTRAVENOUS | Status: DC | PRN
  Administered 2020-11-26: 40 ug via INTRAVENOUS
  Administered 2020-11-26: 80 ug via INTRAVENOUS
  Administered 2020-11-26: 40 ug via INTRAVENOUS
  Administered 2020-11-26 (×2): 80 ug via INTRAVENOUS

## 2020-11-26 MED ORDER — PROPOFOL 10 MG/ML IV BOLUS
INTRAVENOUS | Status: DC | PRN
  Administered 2020-11-26: 130 mg via INTRAVENOUS

## 2020-11-26 MED ORDER — POVIDONE-IODINE 10 % EX SWAB: 2.0000 "application " | Freq: Once | CUTANEOUS | Status: DC

## 2020-11-26 MED ORDER — TRANEXAMIC ACID-NACL 1000-0.7 MG/100ML-% IV SOLN
1000.0000 mg | INTRAVENOUS | Status: AC
  Administered 2020-11-26: 1000 mg via INTRAVENOUS
  Filled 2020-11-26: qty 100

## 2020-11-26 MED ORDER — SUCCINYLCHOLINE CHLORIDE 200 MG/10ML IV SOSY
PREFILLED_SYRINGE | INTRAVENOUS | Status: DC | PRN
  Administered 2020-11-26: 140 mg via INTRAVENOUS

## 2020-11-26 MED ORDER — ACETAMINOPHEN 500 MG PO TABS
1000.0000 mg | ORAL_TABLET | Freq: Once | ORAL | Status: AC
  Administered 2020-11-26: 1000 mg via ORAL
  Filled 2020-11-26: qty 2

## 2020-11-26 MED ORDER — BUPIVACAINE HCL (PF) 0.25 % IJ SOLN
INTRAMUSCULAR | Status: AC
  Filled 2020-11-26: qty 30

## 2020-11-26 MED ORDER — ORAL CARE MOUTH RINSE: 15.0000 mL | Freq: Once | OROMUCOSAL | Status: AC

## 2020-11-26 MED ORDER — PROPOFOL 500 MG/50ML IV EMUL
INTRAVENOUS | Status: DC | PRN
  Administered 2020-11-26: 20 ug/kg/min via INTRAVENOUS

## 2020-11-26 MED ORDER — OXYCODONE HCL 5 MG PO TABS: 5.0000 mg | ORAL_TABLET | ORAL | 0 refills | Status: DC | PRN

## 2020-11-26 MED ORDER — MIDAZOLAM HCL 5 MG/5ML IJ SOLN
INTRAMUSCULAR | Status: DC | PRN
  Administered 2020-11-26: 2 mg via INTRAVENOUS

## 2020-11-26 MED ORDER — SENNA-DOCUSATE SODIUM 8.6-50 MG PO TABS: 2.0000 | ORAL_TABLET | Freq: Every day | ORAL | 1 refills | Status: DC

## 2020-11-26 MED ORDER — CHLORHEXIDINE GLUCONATE 0.12 % MT SOLN
15.0000 mL | Freq: Once | OROMUCOSAL | Status: AC
  Administered 2020-11-26: 15 mL via OROMUCOSAL
  Filled 2020-11-26: qty 15

## 2020-11-26 MED ORDER — PHENYLEPHRINE 40 MCG/ML (10ML) SYRINGE FOR IV PUSH (FOR BLOOD PRESSURE SUPPORT)
PREFILLED_SYRINGE | INTRAVENOUS | Status: AC
  Filled 2020-11-26: qty 10

## 2020-11-26 MED ORDER — ALBUTEROL SULFATE HFA 108 (90 BASE) MCG/ACT IN AERS
INHALATION_SPRAY | RESPIRATORY_TRACT | Status: AC
  Filled 2020-11-26: qty 6.7

## 2020-11-26 MED ORDER — LIDOCAINE 2% (20 MG/ML) 5 ML SYRINGE
INTRAMUSCULAR | Status: AC
  Filled 2020-11-26: qty 5

## 2020-11-26 MED ORDER — ONDANSETRON HCL 4 MG/2ML IJ SOLN
INTRAMUSCULAR | Status: AC
  Filled 2020-11-26: qty 2

## 2020-11-26 MED ORDER — ALBUTEROL SULFATE HFA 108 (90 BASE) MCG/ACT IN AERS
INHALATION_SPRAY | RESPIRATORY_TRACT | Status: DC | PRN
  Administered 2020-11-26: 8 via RESPIRATORY_TRACT

## 2020-11-26 MED ORDER — SUCCINYLCHOLINE CHLORIDE 200 MG/10ML IV SOSY
PREFILLED_SYRINGE | INTRAVENOUS | Status: AC
  Filled 2020-11-26: qty 10

## 2020-11-26 MED ORDER — BUPIVACAINE HCL 0.25 % IJ SOLN
INTRAMUSCULAR | Status: DC | PRN
  Administered 2020-11-26: 10 mL

## 2020-11-26 MED ORDER — FENTANYL CITRATE (PF) 250 MCG/5ML IJ SOLN
INTRAMUSCULAR | Status: AC
  Filled 2020-11-26: qty 5

## 2020-11-26 MED ORDER — MIDAZOLAM HCL 2 MG/2ML IJ SOLN
INTRAMUSCULAR | Status: AC
  Filled 2020-11-26: qty 2

## 2020-11-26 MED ORDER — DEXAMETHASONE SODIUM PHOSPHATE 10 MG/ML IJ SOLN
INTRAMUSCULAR | Status: AC
  Filled 2020-11-26: qty 1

## 2020-11-26 MED ORDER — DEXAMETHASONE SODIUM PHOSPHATE 10 MG/ML IJ SOLN
INTRAMUSCULAR | Status: DC | PRN
  Administered 2020-11-26: 10 mg via INTRAVENOUS

## 2020-11-26 MED ORDER — ONDANSETRON HCL 4 MG PO TABS: 4.0000 mg | ORAL_TABLET | Freq: Three times a day (TID) | ORAL | 0 refills | Status: DC | PRN

## 2020-11-26 SURGICAL SUPPLY — 47 items
APL SKNCLS STERI-STRIP NONHPOA (GAUZE/BANDAGES/DRESSINGS)
BAG COUNTER SPONGE SURGICOUNT (BAG) ×2 IMPLANT
BAG SPNG CNTER NS LX DISP (BAG) ×1
BENZOIN TINCTURE PRP APPL 2/3 (GAUZE/BANDAGES/DRESSINGS) IMPLANT
BNDG COHESIVE 6X5 TAN STRL LF (GAUZE/BANDAGES/DRESSINGS) IMPLANT
BOOTCOVER CLEANROOM LRG (PROTECTIVE WEAR) ×2 IMPLANT
CLSR STERI-STRIP ANTIMIC 1/2X4 (GAUZE/BANDAGES/DRESSINGS) IMPLANT
COVER PERINEAL POST (MISCELLANEOUS) ×2 IMPLANT
COVER SURGICAL LIGHT HANDLE (MISCELLANEOUS) ×2 IMPLANT
DRAPE STERI IOBAN 125X83 (DRAPES) ×2 IMPLANT
DRESSING MEPILEX FLEX 4X4 (GAUZE/BANDAGES/DRESSINGS) IMPLANT
DRSG MEPILEX BORDER 4X4 (GAUZE/BANDAGES/DRESSINGS) ×1 IMPLANT
DRSG MEPILEX FLEX 4X4 (GAUZE/BANDAGES/DRESSINGS) ×2
DRSG PAD ABDOMINAL 8X10 ST (GAUZE/BANDAGES/DRESSINGS) IMPLANT
DURAPREP 26ML APPLICATOR (WOUND CARE) ×2 IMPLANT
ELECT REM PT RETURN 9FT ADLT (ELECTROSURGICAL) ×2
ELECTRODE REM PT RTRN 9FT ADLT (ELECTROSURGICAL) ×1 IMPLANT
GLOVE SURG ENC MOIS LTX SZ7 (GLOVE) ×4 IMPLANT
GLOVE SURG ORTHO LTX SZ7.5 (GLOVE) ×2 IMPLANT
GLOVE SURG UNDER POLY LF SZ7 (GLOVE) ×2 IMPLANT
GOWN STRL REUS W/ TWL LRG LVL3 (GOWN DISPOSABLE) IMPLANT
GOWN STRL REUS W/ TWL XL LVL3 (GOWN DISPOSABLE) ×1 IMPLANT
GOWN STRL REUS W/TWL LRG LVL3 (GOWN DISPOSABLE)
GOWN STRL REUS W/TWL XL LVL3 (GOWN DISPOSABLE) ×2
GUIDE PIN DRILL TIP 2.8X450HIP (PIN) ×6
KIT BASIN OR (CUSTOM PROCEDURE TRAY) ×2 IMPLANT
KIT TURNOVER KIT B (KITS) ×2 IMPLANT
MANIFOLD NEPTUNE II (INSTRUMENTS) ×1 IMPLANT
NEEDLE 22X1 1/2 (OR ONLY) (NEEDLE) ×2 IMPLANT
NS IRRIG 1000ML POUR BTL (IV SOLUTION) ×2 IMPLANT
PACK GENERAL/GYN (CUSTOM PROCEDURE TRAY) ×2 IMPLANT
PAD ARMBOARD 7.5X6 YLW CONV (MISCELLANEOUS) ×4 IMPLANT
PIN GUIDE DRILL TIP 2.8X450HIP (PIN) IMPLANT
PIN GUIDE THRD TT 3.8 (PIN) ×1 IMPLANT
SCREW 16MM THREAD 6.5X75MM (Screw) ×2 IMPLANT
SCREW CANN THRD 6.5X80 (Screw) ×1 IMPLANT
SPONGE T-LAP 18X18 ~~LOC~~+RFID (SPONGE) ×1 IMPLANT
STRIP CLOSURE SKIN 1/2X4 (GAUZE/BANDAGES/DRESSINGS) ×1 IMPLANT
SUT VIC AB 2-0 FS1 27 (SUTURE) ×2 IMPLANT
SUT VIC AB 2-0 SH 27 (SUTURE)
SUT VIC AB 2-0 SH 27XBRD (SUTURE) IMPLANT
SUT VIC AB 3-0 SH 27 (SUTURE)
SUT VIC AB 3-0 SH 27X BRD (SUTURE) IMPLANT
SYR CONTROL 10ML LL (SYRINGE) ×2 IMPLANT
TOWEL GREEN STERILE (TOWEL DISPOSABLE) ×2 IMPLANT
TOWEL GREEN STERILE FF (TOWEL DISPOSABLE) ×2 IMPLANT
WATER STERILE IRR 1000ML POUR (IV SOLUTION) ×2 IMPLANT

## 2020-11-26 NOTE — Op Note (Signed)
11/26/2020  2:49 PM  PATIENT:  Linda Davies    PRE-OPERATIVE DIAGNOSIS: Right valgus impacted femoral neck fracture  POST-OPERATIVE DIAGNOSIS:  Same  PROCEDURE: Percutaneous pinning, right femoral neck fracture  SURGEON:  Johnny Bridge, MD  PHYSICIAN ASSISTANT: Merlene Pulling, PA-C,  present and scrubbed throughout the case, critical for completion in a timely fashion, and for retraction, instrumentation, and closure.  ANESTHESIA:   General  ESTIMATED BLOOD LOSS: Minimal.  PREOPERATIVE INDICATIONS:  Linda Davies is a  70 y.o. female who fell and was found to have a diagnosis of right proximal femur fracture who elected for surgical management.  She was ambulating on it for almost a week before she presented to the office.  The risks benefits and alternatives were discussed with the patient preoperatively including but not limited to the risks of infection, bleeding, nerve injury, cardiopulmonary complications, blood clots, malunion, nonunion, avascular necrosis, the need for revision surgery, the potential for conversion to hemiarthroplasty, among others, and the patient was willing to proceed.  OPERATIVE IMPLANTS: 6.5 mm cannulated titanium screws x3  OPERATIVE FINDINGS: Clinical osteoporosis with weak bone, proximal femur  OPERATIVE PROCEDURE: The patient was brought to the operating room and placed in supine position. IV antibiotics were given. General anesthesia administered. The patient was placed on the fracture table. The operative extremity was positioned, without any significant reduction maneuver and was prepped and draped in usual sterile fashion.  Time out was performed.  Small incision was made distal to the greater trochanter, and 3 guidewires were introduced Into an inverted triangle configuration. The lengths were measured.  It was somewhat challenging to get as much purchase into the head as possible because the neck was gained into the posterior aspect of the head because  of some slight angular displacement from the valgus impacted fracture.  Additionally, I had 1  With the posterior guidewire that I think exited the cortex and then reentered, and I had to readjust this and then ultimately was able to get it centered into the fragment contained within the bone.  The reduction was slightly valgus, and near-anatomic. I opened the cortex with a cannulated drill, and then placed the screws into position. Satisfactory fixation was achieved.  The wounds were irrigated copiously, and repaired with Vicryl with Steri-Strips and sterile gauze. There no complications and the patient tolerated the procedure well.  The patient will be touch toe weightbearing, and will be on aspirin for 4 weeks after discharge for DVT prophylaxis.

## 2020-11-26 NOTE — H&P (Signed)
PREOPERATIVE H&P  Chief Complaint: right hip pain  HPI: Linda Davies is a 70 y.o. female who presents for preoperative history and physical with a diagnosis of right femoral neck fracture. Symptoms are rated as moderate to severe, and have been worsening.  This is significantly impairing activities of daily living.  She has elected for surgical management.  She fell almost a week ago, and presented to our office yesterday with right hip pain.  XR demonstrated valgus impacted femoral neck fracture.    Past Medical History:  Diagnosis Date   Allergic rhinitis    Anxiety    Arthritis    low back and both hands   Basal cell carcinoma    forehead - removed in office   COPD (chronic obstructive pulmonary disease) ([place])    asymptomatic BUT has purse-lipped breathing on exam even when feeling well   Gastritis 08/17/2017   EGD   GERD (gastroesophageal reflux disease)    Iron deficiency anemia 05/2017     Hemoccults neg 05/26/17.  Colonoscopy -->no culprit.  EGD--> gastritis (h pyl NEG).   Melanoma ([place])    Near umbilicus. close f/u with Dr. Delman Cheadle (annual)   Pneumonia    x 1   Tobacco dependence    Past Surgical History:  Procedure Laterality Date   CESAREAN SECTION  1981   spinal   COLONOSCOPY  08/17/2017   Done for unexplained IDA-->Diverticulosis, non-bleeding internal hem, o/w normal.  Repeat 10 yrs.   ESOPHAGOGASTRODUODENOSCOPY  08/17/2017   +Gastritis.  Gastric bx "reactive gastritis".  Bx for H pylori NEG.        Duodenum bx: NORMAL.   EYE SURGERY Left    lasik   MELANOMA EXCISION     abd midline below belly button   Social History   Socioeconomic History   Marital status: Married    Spouse name: Not on file   Number of children: Not on file   Years of education: Not on file   Highest education level: Not on file  Occupational History   Occupation: Teacher, early years/pre  Tobacco Use   Smoking status: Every Day    Packs/day: 0.50    Years: 30.00    Pack years: 15.00     Types: Cigarettes   Smokeless tobacco: Never  Vaping Use   Vaping Use: Never used  Substance and Sexual Activity   Alcohol use: Yes    Alcohol/week: 7.0 standard drinks    Types: 7 Glasses of wine per week    Comment: white wine every evening   Drug use: Never   Sexual activity: Yes    Partners: Male    Birth control/protection: Post-menopausal  Other Topics Concern   Not on file  Social History Narrative   Married (her pt is a husband of mine as well), 3 children, 1 grand child.   Educ: college.   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   Tob: current as of 05/2017; 15 pack-yr hx.   XIP:[id number]   Social Determinants of Health   Financial Resource Strain: Low Risk    Difficulty of Paying Living Expenses: Not hard at all  Food Insecurity: No Food Insecurity   Worried About Charity fundraiser in the Last Year: Never true   Ran Out of Food in the Last Year: Never true  Transportation Needs: No Transportation Needs   Lack of Transportation (Medical): No   Lack of Transportation (Non-Medical): No  Physical Activity: Inactive   Days of Exercise per Week:  0 days   Minutes of Exercise per Session: 0 min  Stress: No Stress Concern Present   Feeling of Stress : Not at all  Social Connections: Moderately Integrated   Frequency of Communication with Friends and Family: More than three times a week   Frequency of Social Gatherings with Friends and Family: More than three times a week   Attends Religious Services: 1 to 4 times per year   Active Member of [employer] or Organizations: No   Attends Archivist Meetings: Never   Marital Status: Married   Family History  Problem Relation Age of Onset   Arthritis Mother    Asthma Mother    COPD Mother    Hypertension Mother    Cancer Father        liver ca   Alcohol abuse Father    Alzheimer's disease Father    Cancer Brother        Leukemia 2017   Hearing loss Brother    Early death Brother    Alcohol abuse Maternal  Grandmother    Cancer Maternal Grandmother    Cancer Maternal Grandfather        lung   Alzheimer's disease Paternal Grandmother    Cancer Paternal Grandfather        lung   Asthma Daughter    Allergies  Allergen Reactions   Latex Rash   Prior to Admission medications   Medication Sig Start Date End Date Taking? Authorizing Provider  ALPRAZolam (XANAX) 0.5 MG tablet TAKE 1 TABLET BY MOUTH TWICE DAILY AS NEEDED FOR ANXIETY 11/03/20  Yes McGowen, Adrian Blackwater, MD  cetirizine (ZYRTEC) 10 MG tablet Take 10 mg by mouth in the morning.   Yes [provider]  cholecalciferol (VITAMIN D) 1000 units tablet Take 1,000 Units by mouth daily.   Yes [provider]  Multiple Vitamin (MULTIVITAMIN) tablet Take 1 tablet by mouth daily.   Yes [provider]  pantoprazole (PROTONIX) 40 MG tablet TAKE 1 TABLET(40 MG) BY MOUTH DAILY 11/19/20  Yes McGowen, Adrian Blackwater, MD  vitamin C (ASCORBIC ACID) 500 MG tablet Take 500 mg by mouth daily.   Yes [provider]     Positive ROS: All other systems have been reviewed and were otherwise negative with the exception of those mentioned in the HPI and as above.  Physical Exam: General: Alert, no acute distress Cardiovascular: No pedal edema Respiratory: No cyanosis, no use of accessory musculature GI: No organomegaly, abdomen is soft and non-tender Skin: No lesions in the area of chief complaint Neurologic: Sensation intact distally Psychiatric: Patient is competent for consent with normal mood and affect Lymphatic: No axillary or cervical lymphadenopathy  MUSCULOSKELETAL: right hip with positive log roll  Assessment: Right femoral neck fracture, 1 week post injury, valgus impacted.   Plan: Plan for Procedure(s): Right hip pinning  The risks benefits and alternatives were discussed with the patient including but not limited to the risks of nonoperative treatment, versus surgical intervention including infection,  bleeding, nerve injury, malunion, nonunion, the need for hip arthroplasty, AVN, the need for revision surgery, hardware prominence, hardware failure, the need for hardware removal, blood clots, cardiopulmonary complications, morbidity, mortality, among others, and they were willing to proceed.       Johnny Bridge, MD Cell [phone]   11/26/2020 1:28 PM

## 2020-11-26 NOTE — Anesthesia Preprocedure Evaluation (Addendum)
Anesthesia Evaluation  Patient identified by MRN, date of birth, ID band Patient awake    Reviewed: Allergy & Precautions  Airway Mallampati: II  TM Distance: >3 FB     Dental   Pulmonary pneumonia, COPD, Current Smoker and Patient abstained from smoking.,    breath sounds clear to auscultation       Cardiovascular negative cardio ROS   Rhythm:Regular Rate:Normal     Neuro/Psych    GI/Hepatic Neg liver ROS, GERD  ,  Endo/Other  negative endocrine ROS  Renal/GU negative Renal ROS     Musculoskeletal   Abdominal   Peds  Hematology   Anesthesia Other Findings   Reproductive/Obstetrics                            Anesthesia Physical Anesthesia Plan  ASA: 3  Anesthesia Plan: General   Post-op Pain Management:    Induction: Intravenous  PONV Risk Score and Plan: 2 and Ondansetron and Propofol infusion  Airway Management Planned: Oral ETT  Additional Equipment:   Intra-op Plan:   Post-operative Plan: Possible Post-op intubation/ventilation  Informed Consent: I have reviewed the patients History and Physical, chart, labs and discussed the procedure including the risks, benefits and alternatives for the proposed anesthesia with the patient or authorized representative who has indicated his/her understanding and acceptance.     Dental advisory given  Plan Discussed with: CRNA and Anesthesiologist  Anesthesia Plan Comments:         Anesthesia Quick Evaluation

## 2020-11-26 NOTE — Transfer of Care (Signed)
Immediate Anesthesia Transfer of Care Note  Patient: Linda Davies  Procedure(s) Performed: PERCUTANEOUS PINNING EXTREMITYproximal femur (Right: Hip)  Patient Location: PACU  Anesthesia Type:General  Level of Consciousness: awake, alert  and oriented  Airway & Oxygen Therapy: Patient Spontanous Breathing and Patient connected to nasal cannula oxygen  Post-op Assessment: Report given to RN and Post -op Vital signs reviewed and stable  Post vital signs: Reviewed and stable  Last Vitals:  Vitals Value Taken Time  BP 125/62 11/26/20 1504  Temp    Pulse 87 11/26/20 1505  Resp 24 11/26/20 1505  SpO2 100 % 11/26/20 1505  Vitals shown include unvalidated device data.  Last Pain:  Vitals:   11/26/20 1142  TempSrc:   PainSc: 0-No pain      Patients Stated Pain Goal: 0 (87/57/97 2820)  Complications: No notable events documented.

## 2020-11-26 NOTE — Anesthesia Procedure Notes (Signed)
Procedure Name: Intubation Date/Time: 11/26/2020 1:41 PM Performed by: Maude Leriche, CRNA Pre-anesthesia Checklist: Patient identified, Emergency Drugs available, Suction available and Patient being monitored Patient Re-evaluated:Patient Re-evaluated prior to induction Oxygen Delivery Method: Circle system utilized Preoxygenation: Pre-oxygenation with 100% oxygen Induction Type: IV induction Ventilation: Mask ventilation without difficulty Laryngoscope Size: Miller and 2 Grade View: Grade I Tube type: Oral Number of attempts: 1 Airway Equipment and Method: Stylet Placement Confirmation: ETT inserted through vocal cords under direct vision, positive ETCO2 and breath sounds checked- equal and bilateral Secured at: 21 cm Tube secured with: Tape Dental Injury: Teeth and Oropharynx as per pre-operative assessment

## 2020-11-26 NOTE — Discharge Instructions (Signed)
Diet: As you were doing prior to hospitalization   Shower:  May shower but keep the wounds dry, use an occlusive plastic wrap, NO SOAKING IN TUB.  If the bandage gets wet, change with a clean dry gauze.  If you have a splint on, leave the splint in place and keep the splint dry with a plastic bag.  Dressing:  You may change your dressing 3-5 days after surgery, if there is no drainage seen on dressings, you can keep on until you see [place] at your follow-up visit. There are sticky tapes (steri-strips) on your wounds and all the stitches are absorbable.  Leave the steri-strips in place when changing your dressings, they will peel off with time, usually 2-3 weeks.  Activity:  Increase activity slowly as tolerated, but follow the weight bearing instructions below.  The rules on driving is that you can not be taking narcotics while you drive, and you must feel in control of the vehicle.    Weight Bearing:   Touch toe weight bearing on right leg. - This means that when you walk or stand, you may only touch the floor for balance, but do not put body weight on your leg.   To prevent constipation: you may use a stool softener such as -  Senokot twice a day Drink plenty of fluids (prune juice may be helpful) and high fiber foods Miralax (over the counter) for constipation as needed.    Itching:  If you experience itching with your medications, try taking only a single pain pill, or even half a pain pill at a time.  You may take up to 10 pain pills per day, and you can also use benadryl over the counter for itching or also to help with sleep.   Precautions:  If you experience chest pain or shortness of breath - call 911 immediately for transfer to the hospital emergency department!!  If you develop a fever greater that 101 F, purulent drainage from wound, increased redness or drainage from wound, or calf pain -- Call the office at [phone]                                                Follow- Up  Appointment:  Please call for an appointment to be seen in 2 weeks [facility] - [phone]

## 2020-11-26 NOTE — Anesthesia Postprocedure Evaluation (Signed)
Anesthesia Post Note  Patient: Linda Davies  Procedure(s) Performed: PERCUTANEOUS PINNING EXTREMITYproximal femur (Right: Hip)     Patient location during evaluation: PACU Anesthesia Type: General Level of consciousness: awake Pain management: pain level controlled Vital Signs Assessment: post-procedure vital signs reviewed and stable Respiratory status: spontaneous breathing Cardiovascular status: stable Postop Assessment: no apparent nausea or vomiting Anesthetic complications: no   No notable events documented.  Last Vitals:  Vitals:   11/26/20 1520 11/26/20 1530  BP: 136/67   Pulse: 91 93  Resp: 18 16  Temp:  36.8 C  SpO2: 100% 96%    Last Pain:  Vitals:   11/26/20 1530  TempSrc:   PainSc: 0-No pain                 [name]

## 2020-11-27 ENCOUNTER — Encounter (HOSPITAL_COMMUNITY): Payer: Self-pay | Admitting: Orthopedic Surgery

## 2020-11-30 NOTE — Discharge Summary (Signed)
Discharge Summary  Patient ID: Linda Davies MRN: [id number] DOB/AGE: 04/26/1950 70 y.o.  Admit date: 11/26/2020 Discharge date: 11/26/20  Admission Diagnoses:  Fracture of femoral neck, right Linda Davies)  Discharge Diagnoses:  Principal Problem:   Fracture of femoral neck, right (Linda Davies) Active Problems:   Femur fracture (Linda Davies)   Past Medical History:  Diagnosis Date   Allergic rhinitis    Anxiety    Arthritis    low back and both hands   Basal cell carcinoma    forehead - removed in office   COPD (chronic obstructive pulmonary disease) ([place])    asymptomatic BUT has purse-lipped breathing on exam even when feeling well   Gastritis 08/17/2017   EGD   GERD (gastroesophageal reflux disease)    Iron deficiency anemia 05/2017     Hemoccults neg 05/26/17.  Colonoscopy -->no culprit.  EGD--> gastritis (h pyl NEG).   Melanoma ([place])    Near umbilicus. close f/u with Dr. Delman Cheadle (annual)   Pneumonia    x 1   Tobacco dependence     Surgeries: Procedure(s): PERCUTANEOUS PINNING EXTREMITYproximal femur on 11/26/2020   Consultants (if any):   Discharged Condition: Improved  Hospital Course: Linda Davies is an 70 y.o. female who was admitted 11/26/2020 with a diagnosis of Fracture of femoral neck, right (Linda Davies) and went to the operating room on 11/26/2020 and underwent the above named procedures.    She was given perioperative antibiotics:  Anti-infectives (From admission, onward)    Start     Dose/Rate Route Frequency Ordered Stop   11/26/20 1100  ceFAZolin (ANCEF) IVPB 2g/100 mL premix        2 g 200 mL/hr over 30 Minutes Intravenous On call to O.R. 11/26/20 1055 11/26/20 1343     .  She was given sequential compression devices, early ambulation, and aspirin for DVT prophylaxis.  She benefited maximally from the hospital stay and there were no complications.    Recent vital signs:  Vitals:   11/26/20 1520 11/26/20 1530  BP: 136/67   Pulse: 91 93  Resp: 18 16  Temp:  98.3 F  (36.8 C)  SpO2: 100% 96%    Recent laboratory studies:  Lab Results  Component Value Date   HGB 14.9 11/26/2020   HGB 14.5 04/01/2020   HGB 15.0 10/23/2018   Lab Results  Component Value Date   WBC 6.4 11/26/2020   PLT 239 11/26/2020   No results found for: INR Lab Results  Component Value Date   NA 139 04/01/2020   K 4.2 04/01/2020   CL 101 04/01/2020   CO2 33 (H) 04/01/2020   BUN 16 04/01/2020   CREATININE 0.72 04/01/2020   GLUCOSE 87 04/01/2020    Discharge Medications:   Allergies as of 11/26/2020       Reactions   Latex Rash        Medication List     TAKE these medications    ALPRAZolam 0.5 MG tablet Commonly known as: XANAX TAKE 1 TABLET BY MOUTH TWICE DAILY AS NEEDED FOR ANXIETY   aspirin EC 325 MG tablet Take 1 tablet (325 mg total) by mouth 2 (two) times daily.   cetirizine 10 MG tablet Commonly known as: ZYRTEC Take 10 mg by mouth in the morning.   cholecalciferol 1000 units tablet Commonly known as: VITAMIN D Take 1,000 Units by mouth daily.   multivitamin tablet Take 1 tablet by mouth daily.   ondansetron 4 MG tablet Commonly known as: Zofran Take 1 tablet (  4 mg total) by mouth every 8 (eight) hours as needed for nausea or vomiting.   oxyCODONE 5 MG immediate release tablet Commonly known as: Roxicodone Take 1 tablet (5 mg total) by mouth every 4 (four) hours as needed for severe pain.   pantoprazole 40 MG tablet Commonly known as: PROTONIX TAKE 1 TABLET(40 MG) BY MOUTH DAILY   sennosides-docusate sodium 8.6-50 MG tablet Commonly known as: SENOKOT-S Take 2 tablets by mouth daily.   vitamin C 500 MG tablet Commonly known as: ASCORBIC ACID Take 500 mg by mouth daily.               Discharge Care Instructions  (From admission, onward)           Start     Ordered   11/26/20 0000  Touch down weight bearing        11/26/20 1506            Diagnostic Studies: DG C-Arm 1-60 Min-No Report  Result Date:  11/26/2020 Fluoroscopy was utilized by the requesting physician.  No radiographic interpretation.   DG HIP OPERATIVE UNILAT WITH PELVIS RIGHT  Result Date: 11/26/2020 CLINICAL DATA:  Right hip pinning EXAM: OPERATIVE right HIP (WITH PELVIS IF PERFORMED) 4 VIEWS TECHNIQUE: Fluoroscopic spot image(s) were submitted for interpretation post-operatively. COMPARISON:  None. FINDINGS: Four C-arm fluoroscopic images were obtained intraoperatively and submitted for post operative interpretation. The initial image demonstrates an impacted fracture of the femoral neck. Subsequent images demonstrate screw fixation of the femoral head/neck. There is no evidence of immediate complication. Fluoro time 2 minutes 19 seconds. Please see the performing provider's procedural report for further detail. IMPRESSION: Status post screw fixation of a right femoral neck fracture without evidence of immediate complication. Electronically Signed   By: Valetta Mole M.D.   On: 11/26/2020 16:54    Disposition: Discharge disposition: 01-Home or Self Care       Discharge Instructions     Touch down weight bearing   Complete by: As directed         Follow-up Information     Marchia Bond, MD. Schedule an appointment as soon as possible for a visit in 2 week(s).   Specialty: Orthopedic Surgery Contact information: [address] [place] [address] [phone]                  Signed: Jola Baptist 11/30/2020, 2:12 PM

## 2020-12-09 DIAGNOSIS — S72001D Fracture of unspecified part of neck of right femur, subsequent encounter for closed fracture with routine healing: Secondary | ICD-10-CM | POA: Diagnosis not present

## 2021-01-06 DIAGNOSIS — S72001D Fracture of unspecified part of neck of right femur, subsequent encounter for closed fracture with routine healing: Secondary | ICD-10-CM | POA: Diagnosis not present

## 2021-01-13 ENCOUNTER — Encounter: Payer: Self-pay | Admitting: Family Medicine

## 2021-02-03 DIAGNOSIS — S72001D Fracture of unspecified part of neck of right femur, subsequent encounter for closed fracture with routine healing: Secondary | ICD-10-CM | POA: Diagnosis not present

## 2021-03-17 DIAGNOSIS — M25552 Pain in left hip: Secondary | ICD-10-CM | POA: Diagnosis not present

## 2021-03-17 DIAGNOSIS — M25551 Pain in right hip: Secondary | ICD-10-CM | POA: Diagnosis not present

## 2021-03-17 DIAGNOSIS — S72001D Fracture of unspecified part of neck of right femur, subsequent encounter for closed fracture with routine healing: Secondary | ICD-10-CM | POA: Diagnosis not present

## 2021-03-30 ENCOUNTER — Telehealth: Payer: Self-pay | Admitting: Family Medicine

## 2021-03-30 NOTE — Telephone Encounter (Signed)
Left message for patient to schedule Annual Wellness Visit.  Please schedule (telephone/video call) with Nurse Health Advisor Tina Betterson, RN at [facility]. Please call [phone] ask for Kathy 

## 2021-04-01 ENCOUNTER — Telehealth: Payer: Self-pay

## 2021-04-01 NOTE — Telephone Encounter (Signed)
Received surgical clearance form for pt. LM for pt to schedule surgical clearance appt.  ?

## 2021-04-02 NOTE — Telephone Encounter (Signed)
LM for pt to return call to confirm appt still needed. ? ?

## 2021-04-02 NOTE — Telephone Encounter (Signed)
Pt scheduled for 3/10. Please review form for clarification if surgical clearance appt needed. Pt last seen 10/2020 ? ? ?Placed on PCP desk to review  ? ?

## 2021-04-02 NOTE — Telephone Encounter (Signed)
Yes appt needed for surgical clearance ?

## 2021-04-05 NOTE — Telephone Encounter (Signed)
Pt advised appt should be kept. ?

## 2021-04-09 ENCOUNTER — Encounter: Payer: Self-pay | Admitting: Family Medicine

## 2021-04-09 ENCOUNTER — Other Ambulatory Visit: Payer: Self-pay

## 2021-04-09 ENCOUNTER — Ambulatory Visit: Payer: Medicare PPO | Admitting: Family Medicine

## 2021-04-09 VITALS — BP 152/85 | HR 83 | Temp 97.9°F | Ht 63.0 in | Wt 133.4 lb

## 2021-04-09 DIAGNOSIS — F17200 Nicotine dependence, unspecified, uncomplicated: Secondary | ICD-10-CM | POA: Diagnosis not present

## 2021-04-09 DIAGNOSIS — Z0000 Encounter for general adult medical examination without abnormal findings: Secondary | ICD-10-CM | POA: Diagnosis not present

## 2021-04-09 DIAGNOSIS — Z01818 Encounter for other preprocedural examination: Secondary | ICD-10-CM

## 2021-04-09 DIAGNOSIS — Z8781 Personal history of (healed) traumatic fracture: Secondary | ICD-10-CM

## 2021-04-09 LAB — CBC
HCT: 41 % (ref 36.0–46.0)
Hemoglobin: 13.6 g/dL (ref 12.0–15.0)
MCHC: 33.3 g/dL (ref 30.0–36.0)
MCV: 94.3 fl (ref 78.0–100.0)
Platelets: 222 10*3/uL (ref 150.0–400.0)
RBC: 4.35 Mil/uL (ref 3.87–5.11)
RDW: 13.4 % (ref 11.5–15.5)
WBC: 7 10*3/uL (ref 4.0–10.5)

## 2021-04-09 LAB — BASIC METABOLIC PANEL
BUN: 15 mg/dL (ref 6–23)
CO2: 30 mEq/L (ref 19–32)
Calcium: 9.7 mg/dL (ref 8.4–10.5)
Chloride: 100 mEq/L (ref 96–112)
Creatinine, Ser: 0.67 mg/dL (ref 0.40–1.20)
GFR: 89.95 mL/min (ref >60.00)
Glucose, Bld: 65 mg/dL — ABNORMAL LOW (ref 70–99)
Potassium: 4.1 mEq/L (ref 3.5–5.1)
Sodium: 141 mEq/L (ref 135–145)

## 2021-04-09 LAB — LIPID PANEL
Cholesterol: 161 mg/dL (ref 0–200)
HDL: 82.3 mg/dL (ref >39.00)
LDL Cholesterol: 60 mg/dL (ref 0–99)
NonHDL: 78.95
Total CHOL/HDL Ratio: 2
Triglycerides: 96 mg/dL (ref 0.0–149.0)
VLDL: 19.2 mg/dL (ref 0.0–40.0)

## 2021-04-09 LAB — VITAMIN D 25 HYDROXY (VIT D DEFICIENCY, FRACTURES): VITD: 74.99 ng/mL (ref 30.00–100.00)

## 2021-04-09 NOTE — Progress Notes (Signed)
Office Note 04/12/2021  CC:  Chief Complaint  Patient presents with   Surgical Clearance    HPI:  Patient is a 71 y.o. female who is here for preoperative medical clearance for upcoming R THA and annual health maintenance exam. Sustained right femoral neck fracture when fell from bench to ground approximately 10/2020, underwent Percutaneous pinning on 11/26/2020 with Dr. Mardelle Matte. She now is set for right total hip arthroplasty scheduled for 05/25/21. She does smoke and carries dx of copd.  Zigmund Daniel has been feeling good other than her musculoskeletal pains. Since having her right femoral neck fracture pinning she has noted onset of some pain in the lateral aspect of left hip as well as in her knees.  No swelling of the joints.  She tried nicotine patch at 1 point since her last visit but it made her feel sick so she stopped it.  She does continue to smoke cigarettes. Prior to her hip fracture she had been able to climb a flight of stairs without unusual shortness of breath or chest pain. She has no cough or wheezing.  She has GAD with anxiety induced insomnia and has been on alprazolam long-term. PMP AWARE reviewed today: most recent rx for alprazolam was filled 03/19/21, # 15, rx by me. No red flags.  Past Medical History:  Diagnosis Date   Allergic rhinitis    Anxiety    Arthritis    low back and both hands   Basal cell carcinoma    forehead - removed in office   COPD (chronic obstructive pulmonary disease) ([place])    asymptomatic BUT has purse-lipped breathing on exam even when feeling well   Gastritis 08/17/2017   EGD   GERD (gastroesophageal reflux disease)    Iron deficiency anemia 05/2017     Hemoccults neg 05/26/17.  Colonoscopy -->no culprit.  EGD--> gastritis (h pyl NEG).   Melanoma ([place])    Near umbilicus. close f/u with Dr. Delman Cheadle (annual)   Pneumonia    x 1   S/P right hip fracture 10/2020   Tobacco dependence     Past Surgical History:  Procedure Laterality  Date   CESAREAN SECTION  1981   spinal   COLONOSCOPY  08/17/2017   Done for unexplained IDA-->Diverticulosis, non-bleeding internal hem, o/w normal.  Repeat 10 yrs.   ESOPHAGOGASTRODUODENOSCOPY  08/17/2017   +Gastritis.  Gastric bx "reactive gastritis".  Bx for H pylori NEG.        Duodenum bx: NORMAL.   EYE SURGERY Left    lasik   HIP PINNING,CANNULATED Right 11/26/2020   Procedure: PERCUTANEOUS PINNING EXTREMITYproximal femur;  Surgeon: Marchia Bond, MD;  Location: [facility];  Service: Orthopedics;  Laterality: Right;   MELANOMA EXCISION     abd midline below belly button    Family History  Problem Relation Age of Onset   Arthritis Mother    Asthma Mother    COPD Mother    Hypertension Mother    Cancer Father        liver ca   Alcohol abuse Father    Alzheimer's disease Father    Cancer Brother        Leukemia 2017   Hearing loss Brother    Early death Brother    Alcohol abuse Maternal Grandmother    Cancer Maternal Grandmother    Cancer Maternal Grandfather        lung   Alzheimer's disease Paternal Grandmother    Cancer Paternal Grandfather  lung   Asthma Daughter     Social History   Socioeconomic History   Marital status: Married    Spouse name: Not on file   Number of children: Not on file   Years of education: Not on file   Highest education level: Bachelor's degree (e.g., BA, AB, BS)  Occupational History   Occupation: Teacher, early years/pre  Tobacco Use   Smoking status: Every Day    Packs/day: 0.50    Years: 30.00    Pack years: 15.00    Types: Cigarettes   Smokeless tobacco: Never  Vaping Use   Vaping Use: Never used  Substance and Sexual Activity   Alcohol use: Yes    Alcohol/week: 7.0 standard drinks    Types: 7 Glasses of wine per week    Comment: white wine every evening   Drug use: Never   Sexual activity: Yes    Partners: Male    Birth control/protection: Post-menopausal  Other Topics Concern   Not on file  Social History  Narrative   Married (her pt is a husband of mine as well), 3 children, 1 grand child.   Educ: college.   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   Tob: current as of 05/2017; 15 pack-yr hx.   TXM:[id number]   Social Determinants of Health   Financial Resource Strain: Low Risk    Difficulty of Paying Living Expenses: Not hard at all  Food Insecurity: No Food Insecurity   Worried About Charity fundraiser in the Last Year: Never true   Ran Out of Food in the Last Year: Never true  Transportation Needs: No Transportation Needs   Lack of Transportation (Medical): No   Lack of Transportation (Non-Medical): No  Physical Activity: Insufficiently Active   Days of Exercise per Week: 1 day   Minutes of Exercise per Session: 10 min  Stress: Stress Concern Present   Feeling of Stress : To some extent  Social Connections: Moderately Integrated   Frequency of Communication with Friends and Family: More than three times a week   Frequency of Social Gatherings with Friends and Family: Three times a week   Attends Religious Services: 1 to 4 times per year   Active Member of Clubs or Organizations: No   Attends Archivist Meetings: Never   Marital Status: Married  Human resources officer Violence: Not At Risk   Fear of Current or Ex-Partner: No   Emotionally Abused: No   Physically Abused: No   Sexually Abused: No    Outpatient Medications Prior to Visit  Medication Sig Dispense Refill   ALPRAZolam (XANAX) 0.5 MG tablet TAKE 1 TABLET BY MOUTH TWICE DAILY AS NEEDED FOR ANXIETY 60 tablet 5   aspirin EC 325 MG tablet Take 1 tablet (325 mg total) by mouth 2 (two) times daily. 60 tablet 0   cetirizine (ZYRTEC) 10 MG tablet Take 10 mg by mouth in the morning.     cholecalciferol (VITAMIN D) 1000 units tablet Take 1,000 Units by mouth daily.     Multiple Vitamin (MULTIVITAMIN) tablet Take 1 tablet by mouth daily.     ondansetron (ZOFRAN) 4 MG tablet Take 1 tablet (4 mg total) by mouth every 8  (eight) hours as needed for nausea or vomiting. 10 tablet 0   oxyCODONE (ROXICODONE) 5 MG immediate release tablet Take 1 tablet (5 mg total) by mouth every 4 (four) hours as needed for severe pain. 30 tablet 0   pantoprazole (PROTONIX) 40 MG tablet TAKE  1 TABLET(40 MG) BY MOUTH DAILY 90 tablet 1   sennosides-docusate sodium (SENOKOT-S) 8.6-50 MG tablet Take 2 tablets by mouth daily. 30 tablet 1   vitamin C (ASCORBIC ACID) 500 MG tablet Take 500 mg by mouth daily.     No facility-administered medications prior to visit.    Allergies  Allergen Reactions   Latex Rash   ROS Review of Systems  Constitutional:  Negative for appetite change, chills, fatigue and fever.  HENT:  Negative for congestion, dental problem, ear pain and sore throat.   Eyes:  Negative for discharge, redness and visual disturbance.  Respiratory:  Negative for cough, chest tightness, shortness of breath and wheezing.   Cardiovascular:  Negative for chest pain, palpitations and leg swelling.  Gastrointestinal:  Negative for abdominal pain, blood in stool, diarrhea, nausea and vomiting.  Genitourinary:  Negative for difficulty urinating, dysuria, flank pain, frequency, hematuria and urgency.  Musculoskeletal:  Positive for arthralgias (L hip and knees as per hpi). Negative for back pain, joint swelling, myalgias and neck stiffness.  Skin:  Negative for pallor and rash.  Neurological:  Negative for dizziness, speech difficulty, weakness and headaches.  Hematological:  Negative for adenopathy. Does not bruise/bleed easily.  Psychiatric/Behavioral:  Negative for confusion and sleep disturbance. The patient is not nervous/anxious.    PE; Vitals with BMI 04/09/2021 11/26/2020 11/26/2020  Height '5\' 3"'$  - -  Weight 133 lbs 6 oz - -  BMI 19.37 - -  Systolic 902 - 409  Diastolic 85 - 67  Pulse 83 93 91  02 sat 94% RA  Exam chaperoned by Deveron Furlong, CMA. Gen: Alert, well appearing.  Patient is oriented to person,  place, time, and situation. AFFECT: pleasant, lucid thought and speech. ENT: Ears: EACs clear, normal epithelium.  TMs with good light reflex and landmarks bilaterally.  Eyes: no injection, icteris, swelling, or exudate.  EOMI, PERRLA. Nose: no drainage or turbinate edema/swelling.  No injection or focal lesion.  Mouth: lips without lesion/swelling.  Oral mucosa pink and moist.  Dentition intact and without obvious caries or gingival swelling.  Oropharynx without erythema, exudate, or swelling.  Neck: supple/nontender.  No LAD, mass, or TM.  Carotid pulses 2+ bilaterally, without bruits. CV: RRR, no m/r/g.   LUNGS: CTA bilat, nonlabored resps, good aeration in all lung fields. ABD: soft, NT, ND, BS normal.  No hepatospenomegaly or mass.  No bruits. EXT: no clubbing, cyanosis, or edema.  Musculoskeletal: no joint swelling, erythema, warmth, or tenderness.  ROM of all joints intact. Skin - no sores or suspicious lesions or rashes or color changes  Pertinent labs:  Lab Results  Component Value Date   TSH 1.26 04/01/2020   Lab Results  Component Value Date   WBC 7.0 04/09/2021   HGB 13.6 04/09/2021   HCT 41.0 04/09/2021   MCV 94.3 04/09/2021   PLT 222.0 04/09/2021   Lab Results  Component Value Date   CREATININE 0.67 04/09/2021   BUN 15 04/09/2021   NA 141 04/09/2021   K 4.1 04/09/2021   CL 100 04/09/2021   CO2 30 04/09/2021   Lab Results  Component Value Date   ALT 18 04/01/2020   AST 20 04/01/2020   ALKPHOS 72 04/01/2020   BILITOT 0.4 04/01/2020   Lab Results  Component Value Date   CHOL 161 04/09/2021   Lab Results  Component Value Date   HDL 82.30 04/09/2021   Lab Results  Component Value Date   LDLCALC 60 04/09/2021  Lab Results  Component Value Date   TRIG 96.0 04/09/2021   Lab Results  Component Value Date   CHOLHDL 2 04/09/2021   ASSESSMENT AND PLAN:   1) Preoperative clearance for right THA: She is doing well and has no problems that would require  further evaluation or testing.  She is cleared for surgery. Although COPD is listed on her past medical history she is essentially without any symptoms of this.  She has no inhalers. She does continue to smoke cigarettes and I encouraged complete cessation today.  2) Health maintenance exam: Reviewed age and gender appropriate health maintenance issues (prudent diet, regular exercise, health risks of tobacco and excessive alcohol, use of seatbelts, fire alarms in home, use of sunscreen).  Also reviewed age and gender appropriate health screening as well as vaccine recommendations. Vaccines: ALL vaccines UTD. Labs: fasting HP labs ordered->she is fasting. Cervical ca screening: via her GYN Breast ca screening: mammogram->via her GYN. Colon ca screening: next colonoscopy 2029. Osteoporosis screening: via her GYN-->was set to start prolia but had hip fx first. We'll plan starting this after her surgery and rehab is done.  Chronic anxiety: Doing well on alprazolam twice daily as needed long-term.  An After Visit Summary was printed and given to the patient.  FOLLOW UP:  No follow-ups on file.  Signed:  [name], MD           04/12/2021

## 2021-04-10 ENCOUNTER — Ambulatory Visit (INDEPENDENT_AMBULATORY_CARE_PROVIDER_SITE_OTHER): Payer: Medicare PPO

## 2021-04-10 DIAGNOSIS — Z1231 Encounter for screening mammogram for malignant neoplasm of breast: Secondary | ICD-10-CM

## 2021-04-10 DIAGNOSIS — Z13820 Encounter for screening for osteoporosis: Secondary | ICD-10-CM | POA: Diagnosis not present

## 2021-04-10 DIAGNOSIS — Z0000 Encounter for general adult medical examination without abnormal findings: Secondary | ICD-10-CM | POA: Diagnosis not present

## 2021-04-10 NOTE — Progress Notes (Addendum)
Subjective:   Linda Davies is a 71 y.o. female who presents for Medicare Annual (Subsequent) preventive examination. I connected with  Saddie Benders on 04/12/21 by a audio enabled telemedicine application and verified that I am speaking with the correct person using two identifiers.  Patient Location: Home  Provider Location: Home Office  I discussed the limitations of evaluation and management by telemedicine. The patient expressed understanding and agreed to proceed.   Review of Systems    Defer to PCP Cardiac Risk Factors include: none     Objective:    Today's Vitals   04/10/21 1005  PainSc: 4    There is no height or weight on file to calculate BMI.  Advanced Directives 04/10/2021 11/26/2020 04/01/2020 08/17/2017  Does Patient Have a Medical Advance Directive? Yes No Yes Yes  Type of Paramedic of [place];Living will - [place];Living will [place];Living will;Out of facility DNR (pink MOST or yellow form)  Does patient want to make changes to medical advance directive? Yes (MAU/Ambulatory/Procedural Areas - Information given) - - No - Patient declined  Copy of [place] in Chart? No - copy requested - No - copy requested No - copy requested  Would patient like information on creating a medical advance directive? - No - Patient declined - -    Current Medications (verified) Outpatient Encounter Medications as of 04/10/2021  Medication Sig   ALPRAZolam (XANAX) 0.5 MG tablet TAKE 1 TABLET BY MOUTH TWICE DAILY AS NEEDED FOR ANXIETY   aspirin EC 325 MG tablet Take 1 tablet (325 mg total) by mouth 2 (two) times daily.   cetirizine (ZYRTEC) 10 MG tablet Take 10 mg by mouth in the morning.   cholecalciferol (VITAMIN D) 1000 units tablet Take 1,000 Units by mouth daily.   Multiple Vitamin (MULTIVITAMIN) tablet Take 1 tablet by mouth daily.   ondansetron (ZOFRAN) 4 MG tablet Take 1 tablet (4 mg total) by  mouth every 8 (eight) hours as needed for nausea or vomiting.   oxyCODONE (ROXICODONE) 5 MG immediate release tablet Take 1 tablet (5 mg total) by mouth every 4 (four) hours as needed for severe pain.   pantoprazole (PROTONIX) 40 MG tablet TAKE 1 TABLET(40 MG) BY MOUTH DAILY   sennosides-docusate sodium (SENOKOT-S) 8.6-50 MG tablet Take 2 tablets by mouth daily.   vitamin C (ASCORBIC ACID) 500 MG tablet Take 500 mg by mouth daily.   No facility-administered encounter medications on file as of 04/10/2021.    Allergies (verified) Latex   History: Past Medical History:  Diagnosis Date   Allergic rhinitis    Anxiety    Arthritis    low back and both hands   Basal cell carcinoma    forehead - removed in office   COPD (chronic obstructive pulmonary disease) ([place])    asymptomatic BUT has purse-lipped breathing on exam even when feeling well   Gastritis 08/17/2017   EGD   GERD (gastroesophageal reflux disease)    Iron deficiency anemia 05/2017     Hemoccults neg 05/26/17.  Colonoscopy -->no culprit.  EGD--> gastritis (h pyl NEG).   Melanoma ([place])    Near umbilicus. close f/u with Dr. Delman Cheadle (annual)   Pneumonia    x 1   S/P right hip fracture 10/2020   Tobacco dependence    Past Surgical History:  Procedure Laterality Date   CESAREAN SECTION  1981   spinal   COLONOSCOPY  08/17/2017   Done for  unexplained IDA-->Diverticulosis, non-bleeding internal hem, o/w normal.  Repeat 10 yrs.   ESOPHAGOGASTRODUODENOSCOPY  08/17/2017   +Gastritis.  Gastric bx "reactive gastritis".  Bx for H pylori NEG.        Duodenum bx: NORMAL.   EYE SURGERY Left    lasik   HIP PINNING,CANNULATED Right 11/26/2020   Procedure: PERCUTANEOUS PINNING EXTREMITYproximal femur;  Surgeon: Marchia Bond, MD;  Location: [facility];  Service: Orthopedics;  Laterality: Right;   MELANOMA EXCISION     abd midline below belly button   Family History  Problem Relation Age of Onset   Arthritis Mother    Asthma Mother     COPD Mother    Hypertension Mother    Cancer Father        liver ca   Alcohol abuse Father    Alzheimer's disease Father    Cancer Brother        Leukemia 2017   Hearing loss Brother    Early death Brother    Alcohol abuse Maternal Grandmother    Cancer Maternal Grandmother    Cancer Maternal Grandfather        lung   Alzheimer's disease Paternal Grandmother    Cancer Paternal Grandfather        lung   Asthma Daughter    Social History   Socioeconomic History   Marital status: Married    Spouse name: Not on file   Number of children: Not on file   Years of education: Not on file   Highest education level: Bachelor's degree (e.g., BA, AB, BS)  Occupational History   Occupation: Teacher, early years/pre  Tobacco Use   Smoking status: Every Day    Packs/day: 0.50    Years: 30.00    Pack years: 15.00    Types: Cigarettes   Smokeless tobacco: Never  Vaping Use   Vaping Use: Never used  Substance and Sexual Activity   Alcohol use: Yes    Alcohol/week: 7.0 standard drinks    Types: 7 Glasses of wine per week    Comment: white wine every evening   Drug use: Never   Sexual activity: Yes    Partners: Male    Birth control/protection: Post-menopausal  Other Topics Concern   Not on file  Social History Narrative   Married (her pt is a husband of mine as well), 3 children, 1 grand child.   Educ: college.   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   Tob: current as of 05/2017; 15 pack-yr hx.   [id number]   Social Determinants of Health   Financial Resource Strain: Low Risk    Difficulty of Paying Living Expenses: Not hard at all  Food Insecurity: No Food Insecurity   Worried About Charity fundraiser in the Last Year: Never true   Ran Out of Food in the Last Year: Never true  Transportation Needs: No Transportation Needs   Lack of Transportation (Medical): No   Lack of Transportation (Non-Medical): No  Physical Activity: Insufficiently Active   Days of Exercise  per Week: 1 day   Minutes of Exercise per Session: 10 min  Stress: Stress Concern Present   Feeling of Stress : To some extent  Social Connections: Moderately Integrated   Frequency of Communication with Friends and Family: More than three times a week   Frequency of Social Gatherings with Friends and Family: Three times a week   Attends Religious Services: 1 to 4 times per year   Active Member of Clubs  or Organizations: No   Attends Archivist Meetings: Never   Marital Status: Married    Tobacco Counseling Ready to quit: Not Answered Counseling given: Not Answered   Clinical Intake:  Pre-visit preparation completed: No  Pain : 0-10 Pain Score: 4  Pain Type: Chronic pain Pain Location: Hip Pain Orientation: Right Pain Onset: More than a month ago Pain Frequency: Intermittent     Nutritional Risks: None Diabetes: No  How often do you need to have someone help you when you read instructions, pamphlets, or other written materials from your doctor or pharmacy?: 1 - Never What is the last grade level you completed in school?: college  Diabetic?No  Interpreter Needed?: No      Activities of Daily Living In your present state of health, do you have any difficulty performing the following activities: 04/12/2021 04/10/2021  Hearing? N N  Vision? N Y  Difficulty concentrating or making decisions? N N  Walking or climbing stairs? N Y  Dressing or bathing? N N  Doing errands, shopping? N N  Preparing Food and eating ? N N  Using the Toilet? N N  In the past six months, have you accidently leaked urine? N N  Do you have problems with loss of bowel control? N N  Managing your Medications? N N  Managing your Finances? N N  Housekeeping or managing your Housekeeping? N N  Some recent data might be hidden    Patient Care Team: Tammi Sou, MD as PCP - General (Family Medicine) Jari Pigg, MD as Consulting Physician (Dermatology) Mauri Pole, MD  as Consulting Physician (Gastroenterology) Marchia Bond, MD as Consulting Physician (Orthopedic Surgery)  Indicate any recent Medical Services you may have received from other than [facility] providers in the past year (date may be approximate).     Assessment:   This is a routine wellness examination for Ivanka.  Hearing/Vision screen No results found.  Dietary issues and exercise activities discussed:     Goals Addressed   None   Depression Screen PHQ 2/9 Scores 04/10/2021 11/03/2020 04/01/2020 04/01/2020 10/23/2018 01/29/2018 07/04/2016  PHQ - 2 Score 0 1 0 0 0 0 0  PHQ- 9 Score - - 0 - - - -    Fall Risk Fall Risk  04/10/2021 04/05/2021 11/03/2020 04/01/2020 10/23/2018  Falls in the past year? 1 1 0 0 0  Number falls in past yr: 0 0 0 0 0  Injury with Fall? 1 1 0 0 0  Risk for fall due to : History of fall(s) - - - -  Follow up Falls evaluation completed - Falls evaluation completed Falls prevention discussed Falls evaluation completed    FALL RISK PREVENTION PERTAINING TO THE HOME:  Any stairs in or around the home? Yes  If so, are there any without handrails? No  Home free of loose throw rugs in walkways, pet beds, electrical cords, etc? Yes  Adequate lighting in your home to reduce risk of falls? Yes   ASSISTIVE DEVICES UTILIZED TO PREVENT FALLS:  Life alert? No  Use of a cane, walker or w/c? No  Grab bars in the bathroom? No  Shower chair or bench in shower? Yes  Elevated toilet seat or a handicapped toilet? No   TIMED UP AND GO:  Was the test performed? No .  Length of time to ambulate 10 feet: n/a sec.    Cognitive Function:     6CIT Screen 04/10/2021  What Year? 0 points  What month? 0 points  What time? 0 points  Count back from 20 0 points  Months in reverse 0 points  Repeat phrase 0 points  Total Score 0    Immunizations Immunization History  Administered Date(s) Administered   Fluad Quad(high Dose 65+) 10/23/2018, 04/01/2020, 11/03/2020   Influenza Inj  Mdck Quad Pf 10/26/2016   Influenza,inj,Quad PF,6+ Mos 11/09/2015   Influenza,inj,quad, With Preservative 11/09/2015   Influenza-Unspecified 10/01/2017   Moderna Sars-Covid-2 Vaccination 04/08/2019, 05/10/2019   Pneumococcal Conjugate-13 01/29/2018   Pneumococcal Polysaccharide-23 04/01/2020   Tdap 05/24/2017   Zoster Recombinat (Shingrix) 05/24/2017, 10/30/2017    TDAP status: Up to date  Flu Vaccine status: Up to date  Pneumococcal vaccine status: Up to date  Covid-19 vaccine status: Information provided on how to obtain vaccines.   Qualifies for Shingles Vaccine? Yes   Zostavax completed No   Shingrix Completed?: Yes  Screening Tests Health Maintenance  Topic Date Due   MAMMOGRAM  04/09/2020   COVID-19 Vaccine (3 - Moderna risk series) 04/25/2021 (Originally 06/07/2019)   DEXA SCAN  04/10/2022 (Originally 01/29/2018)   TETANUS/TDAP  05/25/2027   COLONOSCOPY (Pts 45-51yr Insurance coverage will need to be confirmed)  08/18/2027   Pneumonia Vaccine 69 Years old  Completed   INFLUENZA VACCINE  Completed   Hepatitis C Screening  Completed   Zoster Vaccines- Shingrix  Completed   HPV VACCINES  Aged Out    Health Maintenance  Health Maintenance Due  Topic Date Due   MAMMOGRAM  04/09/2020    Colorectal cancer screening: Type of screening: Colonoscopy. Completed 08/17/17. Repeat every 10 years  Mammogram status: Ordered 04/10/21. Pt provided with contact info and advised to call to schedule appt.   Bone Density status: Ordered 04/10/21. Pt provided with contact info and advised to call to schedule appt.  Lung Cancer Screening: (Low Dose CT Chest recommended if Age 69-80years, 30 pack-year currently smoking OR have quit w/in 15years.) does qualify.   Lung Cancer Screening Referral: n/a  Additional Screening:  Hepatitis C Screening: does qualify; Completed 05/24/17  Vision Screening: Recommended annual ophthalmology exams for early detection of glaucoma and other  disorders of the eye. Is the patient up to date with their annual eye exam?  Yes  Who is the provider or what is the name of the office in which the patient attends annual eye exams? Patient is unaware of name.  Dental Screening: Recommended annual dental exams for proper oral hygiene  Community Resource Referral / Chronic Care Management: CRR required this visit?  No   CCM required this visit?  No      Plan:     I have personally reviewed and noted the following in the patients chart:   Medical and social history Use of alcohol, tobacco or illicit drugs  Current medications and supplements including opioid prescriptions.  Functional ability and status Nutritional status Physical activity Advanced directives List of other physicians Hospitalizations, surgeries, and ER visits in previous 12 months Vitals Screenings to include cognitive, depression, and falls Referrals and appointments  In addition, I have reviewed and discussed with patient certain preventive protocols, quality metrics, and best practice recommendations. A written personalized care plan for preventive services as well as general preventive health recommendations were provided to patient.     [facility]  04/12/2021   Nurse Notes: Non face to face 16 minutes. Ms. DOrd, Thank you for taking time to come for your Medicare Wellness Visit. I appreciate your  ongoing commitment to your health goals. Please review the following plan we discussed and let me know if I can assist you in the future.   These are the goals we discussed:  Goals      Patient Stated     Maintain current health & increase activity        This is a list of the screening recommended for you and due dates:  Health Maintenance  Topic Date Due   Mammogram  04/09/2020   COVID-19 Vaccine (3 - Moderna risk series) 04/25/2021*   DEXA scan (bone density measurement)  04/10/2022*   Tetanus Vaccine  05/25/2027   Colon Cancer  Screening  08/18/2027   Pneumonia Vaccine  Completed   Flu Shot  Completed   Hepatitis C Screening: USPSTF Recommendation to screen - Ages 18-79 yo.  Completed   Zoster (Shingles) Vaccine  Completed   HPV Vaccine  Aged Out  *Topic was postponed. The date shown is not the original due date.

## 2021-04-18 ENCOUNTER — Other Ambulatory Visit: Payer: Self-pay | Admitting: Family Medicine

## 2021-04-20 NOTE — Telephone Encounter (Signed)
Requesting: alprazolam ?Contract: 3/2//22 ?UDS: 11/03/20 ?Last Visit: 04/09/21 ?Next Visit: N/A ?Last Refill: 11/03/20(60,5) ? ?Please Advise. Med pending ?

## 2021-04-21 NOTE — Telephone Encounter (Signed)
Pt advised refill sent. °

## 2021-05-03 ENCOUNTER — Inpatient Hospital Stay (HOSPITAL_COMMUNITY)
Admission: AD | Admit: 2021-05-03 | Discharge: 2021-05-04 | DRG: 470 | Disposition: A | Discharge status: Home / Self Care | Payer: Medicare PPO | Source: Ambulatory Visit | Attending: Orthopedic Surgery | Admitting: Orthopedic Surgery

## 2021-05-03 ENCOUNTER — Other Ambulatory Visit: Payer: Self-pay

## 2021-05-03 DIAGNOSIS — Z87891 Personal history of nicotine dependence: Secondary | ICD-10-CM

## 2021-05-03 DIAGNOSIS — M25551 Pain in right hip: Secondary | ICD-10-CM | POA: Diagnosis not present

## 2021-05-03 DIAGNOSIS — J449 Chronic obstructive pulmonary disease, unspecified: Secondary | ICD-10-CM | POA: Diagnosis not present

## 2021-05-03 DIAGNOSIS — Z96641 Presence of right artificial hip joint: Secondary | ICD-10-CM | POA: Diagnosis not present

## 2021-05-03 DIAGNOSIS — Z85820 Personal history of malignant melanoma of skin: Secondary | ICD-10-CM | POA: Diagnosis not present

## 2021-05-03 DIAGNOSIS — M1611 Unilateral primary osteoarthritis, right hip: Secondary | ICD-10-CM | POA: Diagnosis not present

## 2021-05-03 DIAGNOSIS — Z20822 Contact with and (suspected) exposure to covid-19: Secondary | ICD-10-CM | POA: Diagnosis present

## 2021-05-03 DIAGNOSIS — Z8261 Family history of arthritis: Secondary | ICD-10-CM

## 2021-05-03 DIAGNOSIS — M87051 Idiopathic aseptic necrosis of right femur: Secondary | ICD-10-CM | POA: Diagnosis present

## 2021-05-03 DIAGNOSIS — F419 Anxiety disorder, unspecified: Secondary | ICD-10-CM | POA: Diagnosis not present

## 2021-05-03 DIAGNOSIS — S7291XA Unspecified fracture of right femur, initial encounter for closed fracture: Secondary | ICD-10-CM | POA: Diagnosis present

## 2021-05-03 DIAGNOSIS — G8918 Other acute postprocedural pain: Secondary | ICD-10-CM | POA: Diagnosis not present

## 2021-05-03 DIAGNOSIS — Z820 Family history of epilepsy and other diseases of the nervous system: Secondary | ICD-10-CM

## 2021-05-03 DIAGNOSIS — Z91040 Latex allergy status: Secondary | ICD-10-CM | POA: Diagnosis not present

## 2021-05-03 DIAGNOSIS — Z800 Family history of malignant neoplasm of digestive organs: Secondary | ICD-10-CM | POA: Diagnosis not present

## 2021-05-03 DIAGNOSIS — Z79899 Other long term (current) drug therapy: Secondary | ICD-10-CM | POA: Diagnosis not present

## 2021-05-03 DIAGNOSIS — Z811 Family history of alcohol abuse and dependence: Secondary | ICD-10-CM

## 2021-05-03 DIAGNOSIS — Z8249 Family history of ischemic heart disease and other diseases of the circulatory system: Secondary | ICD-10-CM | POA: Diagnosis not present

## 2021-05-03 DIAGNOSIS — K219 Gastro-esophageal reflux disease without esophagitis: Secondary | ICD-10-CM | POA: Diagnosis present

## 2021-05-03 DIAGNOSIS — Z471 Aftercare following joint replacement surgery: Secondary | ICD-10-CM | POA: Diagnosis not present

## 2021-05-03 DIAGNOSIS — S72051A Unspecified fracture of head of right femur, initial encounter for closed fracture: Secondary | ICD-10-CM

## 2021-05-03 DIAGNOSIS — D649 Anemia, unspecified: Secondary | ICD-10-CM | POA: Diagnosis not present

## 2021-05-03 DIAGNOSIS — Z825 Family history of asthma and other chronic lower respiratory diseases: Secondary | ICD-10-CM

## 2021-05-03 DIAGNOSIS — S7291XK Unspecified fracture of right femur, subsequent encounter for closed fracture with nonunion: Secondary | ICD-10-CM

## 2021-05-03 DIAGNOSIS — S72059A Unspecified fracture of head of unspecified femur, initial encounter for closed fracture: Secondary | ICD-10-CM | POA: Diagnosis present

## 2021-05-03 LAB — CBC
HCT: 43.3 % (ref 36.0–46.0)
Hemoglobin: 14.4 g/dL (ref 12.0–15.0)
MCH: 31.6 pg (ref 26.0–34.0)
MCHC: 33.3 g/dL (ref 30.0–36.0)
MCV: 95 fL (ref 80.0–100.0)
Platelets: 287 10*3/uL (ref 150–400)
RBC: 4.56 MIL/uL (ref 3.87–5.11)
RDW: 12.4 % (ref 11.5–15.5)
WBC: 7.9 10*3/uL (ref 4.0–10.5)
nRBC: 0 % (ref 0.0–0.2)

## 2021-05-03 LAB — BASIC METABOLIC PANEL
Anion gap: 6 (ref 5–15)
BUN: 12 mg/dL (ref 8–23)
CO2: 33 mmol/L — ABNORMAL HIGH (ref 22–32)
Calcium: 9.4 mg/dL (ref 8.9–10.3)
Chloride: 98 mmol/L (ref 98–111)
Creatinine, Ser: 0.69 mg/dL (ref 0.44–1.00)
GFR, Estimated: >60 mL/min (ref >60)
Glucose, Bld: 108 mg/dL — ABNORMAL HIGH (ref 70–99)
Potassium: 3.5 mmol/L (ref 3.5–5.1)
Sodium: 137 mmol/L (ref 135–145)

## 2021-05-03 LAB — HIV ANTIBODY (ROUTINE TESTING W REFLEX): HIV Screen 4th Generation wRfx: NONREACTIVE

## 2021-05-03 LAB — MRSA NEXT GEN BY PCR, NASAL: MRSA by PCR Next Gen: NOT DETECTED

## 2021-05-03 MED ORDER — TRANEXAMIC ACID-NACL 1000-0.7 MG/100ML-% IV SOLN
1000.0000 mg | INTRAVENOUS | Status: AC
  Administered 2021-05-04: 1000 mg via INTRAVENOUS
  Filled 2021-05-03: qty 100

## 2021-05-03 MED ORDER — ONDANSETRON HCL 4 MG PO TABS: 4.0000 mg | ORAL_TABLET | Freq: Four times a day (QID) | ORAL | Status: DC | PRN

## 2021-05-03 MED ORDER — BISACODYL 5 MG PO TBEC: 5.0000 mg | DELAYED_RELEASE_TABLET | Freq: Every day | ORAL | Status: DC | PRN

## 2021-05-03 MED ORDER — CEFAZOLIN SODIUM-DEXTROSE 2-4 GM/100ML-% IV SOLN
2.0000 g | INTRAVENOUS | Status: AC
  Administered 2021-05-04: 2 g via INTRAVENOUS
  Filled 2021-05-03: qty 100

## 2021-05-03 MED ORDER — OXYCODONE HCL 5 MG PO TABS
5.0000 mg | ORAL_TABLET | ORAL | Status: DC | PRN
  Administered 2021-05-03: 5 mg via ORAL
  Filled 2021-05-03: qty 1

## 2021-05-03 MED ORDER — METHOCARBAMOL 500 MG PO TABS
500.0000 mg | ORAL_TABLET | Freq: Four times a day (QID) | ORAL | Status: DC | PRN
  Administered 2021-05-03: 500 mg via ORAL
  Filled 2021-05-03: qty 1

## 2021-05-03 MED ORDER — DIPHENHYDRAMINE HCL 12.5 MG/5ML PO ELIX: 12.5000 mg | ORAL_SOLUTION | ORAL | Status: DC | PRN

## 2021-05-03 MED ORDER — DEXAMETHASONE SODIUM PHOSPHATE 10 MG/ML IJ SOLN: 8.0000 mg | Freq: Once | INTRAMUSCULAR | Status: DC

## 2021-05-03 MED ORDER — BUPIVACAINE LIPOSOME 1.3 % IJ SUSP: 10.0000 mL | INTRAMUSCULAR | Status: DC

## 2021-05-03 MED ORDER — POVIDONE-IODINE 10 % EX SWAB
2.0000 "application " | Freq: Once | CUTANEOUS | Status: AC
  Administered 2021-05-04: 2 via TOPICAL

## 2021-05-03 MED ORDER — POLYETHYLENE GLYCOL 3350 17 G PO PACK: 17.0000 g | PACK | Freq: Every day | ORAL | Status: DC | PRN

## 2021-05-03 MED ORDER — ACETAMINOPHEN 500 MG PO TABS: 1000.0000 mg | ORAL_TABLET | Freq: Once | ORAL | Status: DC

## 2021-05-03 MED ORDER — ACETAMINOPHEN 325 MG PO TABS: 325.0000 mg | ORAL_TABLET | Freq: Four times a day (QID) | ORAL | Status: DC | PRN

## 2021-05-03 MED ORDER — ONDANSETRON HCL 4 MG/2ML IJ SOLN: 4.0000 mg | Freq: Four times a day (QID) | INTRAMUSCULAR | Status: DC | PRN

## 2021-05-03 MED ORDER — HYDROMORPHONE HCL 1 MG/ML IJ SOLN: 0.5000 mg | INTRAMUSCULAR | Status: DC | PRN

## 2021-05-03 MED ORDER — METHOCARBAMOL 1000 MG/10ML IJ SOLN
500.0000 mg | Freq: Four times a day (QID) | INTRAVENOUS | Status: DC | PRN
  Filled 2021-05-03: qty 5

## 2021-05-03 MED ORDER — ACETAMINOPHEN 500 MG PO TABS
1000.0000 mg | ORAL_TABLET | Freq: Four times a day (QID) | ORAL | Status: DC
  Administered 2021-05-03 – 2021-05-04 (×3): 1000 mg via ORAL
  Filled 2021-05-03 (×3): qty 2

## 2021-05-03 MED ORDER — FLEET ENEMA 7-19 GM/118ML RE ENEM: 1.0000 | ENEMA | Freq: Once | RECTAL | Status: DC | PRN

## 2021-05-03 MED ORDER — OXYCODONE HCL 5 MG PO TABS
10.0000 mg | ORAL_TABLET | ORAL | Status: DC | PRN
  Administered 2021-05-03 – 2021-05-04 (×2): 10 mg via ORAL
  Filled 2021-05-03 (×2): qty 2

## 2021-05-03 MED ORDER — POVIDONE-IODINE 10 % EX SWAB
2.0000 "application " | Freq: Once | CUTANEOUS | Status: DC
  Administered 2021-05-04: 2 via TOPICAL

## 2021-05-03 MED ORDER — SENNA 8.6 MG PO TABS
1.0000 | ORAL_TABLET | Freq: Two times a day (BID) | ORAL | Status: DC
  Administered 2021-05-03: 8.6 mg via ORAL
  Filled 2021-05-03: qty 1

## 2021-05-03 NOTE — H&P (Addendum)
H&P ? ?Chief Complaint: right hip pain ? ?HPI: ?Linda Davies is a 72 y.o. female with history of anxiety, GERD, tobacco dependence who presented to our office today with increased right hip pain.  She has a history of a right hip percutaneous pinning after right hip femoral neck fracture which was performed by Dr. Mardelle Matte on 11/26/2020.  She over the last few months has noticed increased pain at lateral right hip and x-ray showed the femoral neck had healed shortened and that percutaneous screws were approximately 1 inch from lateral right femur.  She was scheduled for a right total hip replacement and percutaneous pin removal later this month with Dr. Mardelle Matte.  She came into the office today with increased pain at the right hip and increased swelling.  X-rays were taken showing a collapsed femoral head.  She rates her pain to be severe located at the right hip worse with movement and weightbearing and better with rest.  She is taking tramadol for the pain but it does not help completely.  This is significantly impairing activities of daily living.  ? ?Past Medical History:  ?Diagnosis Date  ? Allergic rhinitis   ? Anxiety   ? Arthritis   ? low back and both hands  ? Basal cell carcinoma   ? forehead - removed in office  ? COPD (chronic obstructive pulmonary disease) ([place])   ? asymptomatic BUT has purse-lipped breathing on exam even when feeling well  ? Gastritis 08/17/2017  ? EGD  ? GERD (gastroesophageal reflux disease)   ? Iron deficiency anemia 05/2017  ?   Hemoccults neg 05/26/17.  Colonoscopy -->no culprit.  EGD--> gastritis (h pyl NEG).  ? Melanoma ([place])   ? Near umbilicus. close f/u with Dr. Delman Cheadle (annual)  ? Pneumonia   ? x 1  ? S/P right hip fracture 10/2020  ? Tobacco dependence   ? ?Past Surgical History:  ?Procedure Laterality Date  ? [place]  ? spinal  ? COLONOSCOPY  08/17/2017  ? Done for unexplained IDA-->Diverticulosis, non-bleeding internal hem, o/w normal.  Repeat 10 yrs.  ?  ESOPHAGOGASTRODUODENOSCOPY  08/17/2017  ? +Gastritis.  Gastric bx "reactive gastritis".  Bx for H pylori NEG.        Duodenum bx: NORMAL.  ? EYE SURGERY Left   ? lasik  ? HIP PINNING,CANNULATED Right 11/26/2020  ? Procedure: PERCUTANEOUS PINNING EXTREMITYproximal femur;  Surgeon: Marchia Bond, MD;  Location: [place];  Service: Orthopedics;  Laterality: Right;  ? MELANOMA EXCISION    ? abd midline below belly button  ? ?Social History  ? ?Socioeconomic History  ? Marital status: Married  ?  Spouse name: Not on file  ? Number of children: Not on file  ? Years of education: Not on file  ? Highest education level: Bachelor's degree (e.g., BA, AB, BS)  ?Occupational History  ? Occupation: Teacher, early years/pre  ?Tobacco Use  ? Smoking status: Every Day  ?  Packs/day: 0.50  ?  Years: 30.00  ?  Pack years: 15.00  ?  Types: Cigarettes  ? Smokeless tobacco: Never  ?Vaping Use  ? Vaping Use: Never used  ?Substance and Sexual Activity  ? Alcohol use: Yes  ?  Alcohol/week: 7.0 standard drinks  ?  Types: 7 Glasses of wine per week  ?  Comment: white wine every evening  ? Drug use: Never  ? Sexual activity: Yes  ?  Partners: Male  ?  Birth control/protection: Post-menopausal  ?Other Topics Concern  ?  Not on file  ?Social History Narrative  ? Married (her pt is a husband of mine as well), 3 children, 1 grand child.  ? Educ: college.  ? 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  ? Tob: current as of 05/2017; 15 pack-yr hx.  ? INO:[id number]  ? ?Social Determinants of Health  ? ?Financial Resource Strain: Low Risk   ? Difficulty of Paying Living Expenses: Not hard at all  ?Food Insecurity: No Food Insecurity  ? Worried About Charity fundraiser in the Last Year: Never true  ? Ran Out of Food in the Last Year: Never true  ?Transportation Needs: No Transportation Needs  ? Lack of Transportation (Medical): No  ? Lack of Transportation (Non-Medical): No  ?Physical Activity: Insufficiently Active  ? Days of Exercise per Week: 1 day  ?  Minutes of Exercise per Session: 10 min  ?Stress: Stress Concern Present  ? Feeling of Stress : To some extent  ?Social Connections: Moderately Integrated  ? Frequency of Communication with Friends and Family: More than three times a week  ? Frequency of Social Gatherings with Friends and Family: Three times a week  ? Attends Religious Services: 1 to 4 times per year  ? Active Member of Clubs or Organizations: No  ? Attends Archivist Meetings: Never  ? Marital Status: Married  ? ?Family History  ?Problem Relation Age of Onset  ? Arthritis Mother   ? Asthma Mother   ? COPD Mother   ? Hypertension Mother   ? Cancer Father   ?     liver ca  ? Alcohol abuse Father   ? Alzheimer's disease Father   ? Cancer Brother   ?     Leukemia 2017  ? Hearing loss Brother   ? Early death Brother   ? Alcohol abuse Maternal Grandmother   ? Cancer Maternal Grandmother   ? Cancer Maternal Grandfather   ?     lung  ? Alzheimer's disease Paternal Grandmother   ? Cancer Paternal Grandfather   ?     lung  ? Asthma Daughter   ? ?Allergies  ?Allergen Reactions  ? Latex Rash  ? ?Prior to Admission medications   ?Medication Sig Start Date End Date Taking? Authorizing Provider  ?ALPRAZolam (XANAX) 0.5 MG tablet TAKE 1/2 TO 1 TABLET BY MOUTH TWICE DAILY AS NEEDED FOR ANXIETY 04/21/21   McGowen, Adrian Blackwater, MD  ?aspirin EC 325 MG tablet Take 1 tablet (325 mg total) by mouth 2 (two) times daily. 11/26/20   Ventura Bruns, PA-C  ?cetirizine (ZYRTEC) 10 MG tablet Take 10 mg by mouth in the morning.    [provider]  ?cholecalciferol (VITAMIN D) 1000 units tablet Take 1,000 Units by mouth daily.    [provider]  ?Multiple Vitamin (MULTIVITAMIN) tablet Take 1 tablet by mouth daily.    [provider]  ?ondansetron (ZOFRAN) 4 MG tablet Take 1 tablet (4 mg total) by mouth every 8 (eight) hours as needed for nausea or vomiting. 11/26/20   Ventura Bruns, PA-C  ?oxyCODONE (ROXICODONE) 5 MG immediate release tablet  Take 1 tablet (5 mg total) by mouth every 4 (four) hours as needed for severe pain. 11/26/20   Merlene Pulling K, PA-C  ?pantoprazole (PROTONIX) 40 MG tablet TAKE 1 TABLET(40 MG) BY MOUTH DAILY 11/19/20   McGowen, Adrian Blackwater, MD  ?sennosides-docusate sodium (SENOKOT-S) 8.6-50 MG tablet Take 2 tablets by mouth daily. 11/26/20   Ventura Bruns,  PA-C  ?vitamin C (ASCORBIC ACID) 500 MG tablet Take 500 mg by mouth daily.    [provider]  ? ? ? ?Positive ROS: All other systems have been reviewed and were otherwise negative with the exception of those mentioned in the HPI and as above. ? ?Physical Exam: ?General: Alert, no acute distress ?Cardiovascular: No pedal edema ?Respiratory: No cyanosis, no use of accessory musculature ?GI: No organomegaly, abdomen is soft and non-tender ?Skin: No lesions in the area of chief complaint ?Neurologic: Sensation intact distally ?Psychiatric: Patient is competent for consent with normal mood and affect ?Lymphatic: No axillary or cervical lymphadenopathy ? ?MUSCULOSKELETAL: Severe pain with internal and external rotation of the right hip.  Severe pain with weightbearing.  Neurovascular intact distally. ? ?Assessment: ?Femoral head collapse after right hip percutaneous pinning ? ? ?Plan: ?Discussed options with both patient and husband.  She has elected for direct admission for pain control with plan for percutaneous pin removal tomorrow and total hip replacement.  The risks benefits and alternatives were discussed with the patient including but not limited to the risks of nonoperative treatment, versus surgical intervention including infection, bleeding, nerve injury,  blood clots, cardiopulmonary complications, morbidity, mortality, among others, and they were willing to proceed.  ? ?Plan for Procedure(s): ?TOTAL HIP ARTHROPLASTY ? ?Please keep n.p.o. after midnight in anticipation of surgery. ? ? ?Ventura Bruns, PA-C ? ? ? ?05/03/2021 ?12:43 PM  ?

## 2021-05-03 NOTE — Plan of Care (Signed)
Pt direct admit from ortho clinic. MRSA swabbed. Admission completed. ? ? ?Problem: Education: ?Goal: Knowledge of General Education information will improve ?Description: Including pain rating scale, medication(s)/side effects and non-pharmacologic comfort measures ?Outcome: Progressing ?  ?Problem: Health Behavior/Discharge Planning: ?Goal: Ability to manage health-related needs will improve ?Outcome: Progressing ?  ?Problem: Clinical Measurements: ?Goal: Ability to maintain clinical measurements within normal limits will improve ?Outcome: Progressing ?Goal: Will remain free from infection ?Outcome: Progressing ?Goal: Cardiovascular complication will be avoided ?Outcome: Progressing ?  ?Problem: Activity: ?Goal: Risk for activity intolerance will decrease ?Outcome: Progressing ?  ?Problem: Coping: ?Goal: Level of anxiety will decrease ?Outcome: Progressing ?  ?Problem: Elimination: ?Goal: Will not experience complications related to bowel motility ?Outcome: Progressing ?Goal: Will not experience complications related to urinary retention ?Outcome: Progressing ?  ?Problem: Pain Managment: ?Goal: General experience of comfort will improve ?Outcome: Progressing ?  ?Problem: Safety: ?Goal: Ability to remain free from injury will improve ?Outcome: Progressing ?  ?Problem: Skin Integrity: ?Goal: Risk for impaired skin integrity will decrease ?Outcome: Progressing ?  ?

## 2021-05-04 ENCOUNTER — Inpatient Hospital Stay (HOSPITAL_COMMUNITY): Payer: Medicare PPO

## 2021-05-04 ENCOUNTER — Encounter (HOSPITAL_COMMUNITY)
Admission: AD | Disposition: A | Discharge status: Home / Self Care | Payer: Self-pay | Source: Ambulatory Visit | Attending: Orthopedic Surgery

## 2021-05-04 ENCOUNTER — Encounter (HOSPITAL_COMMUNITY): Payer: Self-pay | Admitting: Orthopedic Surgery

## 2021-05-04 ENCOUNTER — Inpatient Hospital Stay (HOSPITAL_COMMUNITY): Payer: Medicare PPO | Admitting: Anesthesiology

## 2021-05-04 ENCOUNTER — Other Ambulatory Visit: Payer: Self-pay

## 2021-05-04 DIAGNOSIS — M1611 Unilateral primary osteoarthritis, right hip: Secondary | ICD-10-CM

## 2021-05-04 DIAGNOSIS — Z96641 Presence of right artificial hip joint: Principal | ICD-10-CM

## 2021-05-04 DIAGNOSIS — J449 Chronic obstructive pulmonary disease, unspecified: Secondary | ICD-10-CM | POA: Diagnosis not present

## 2021-05-04 DIAGNOSIS — G8918 Other acute postprocedural pain: Secondary | ICD-10-CM | POA: Diagnosis not present

## 2021-05-04 DIAGNOSIS — F419 Anxiety disorder, unspecified: Secondary | ICD-10-CM | POA: Diagnosis not present

## 2021-05-04 DIAGNOSIS — D649 Anemia, unspecified: Secondary | ICD-10-CM

## 2021-05-04 HISTORY — PX: TOTAL HIP ARTHROPLASTY: SHX124

## 2021-05-04 SURGERY — ARTHROPLASTY, HIP, TOTAL, ANTERIOR APPROACH
Anesthesia: Spinal | Site: Hip | Laterality: Right

## 2021-05-04 MED ORDER — METOCLOPRAMIDE HCL 5 MG/ML IJ SOLN: 5.0000 mg | Freq: Three times a day (TID) | INTRAMUSCULAR | Status: DC | PRN

## 2021-05-04 MED ORDER — SODIUM CHLORIDE 0.9 % IR SOLN
Status: DC | PRN
  Administered 2021-05-04: 1000 mL

## 2021-05-04 MED ORDER — PHENYLEPHRINE HCL-NACL 20-0.9 MG/250ML-% IV SOLN
INTRAVENOUS | Status: DC | PRN
  Administered 2021-05-04: 50 ug/min via INTRAVENOUS

## 2021-05-04 MED ORDER — PROPOFOL 500 MG/50ML IV EMUL
INTRAVENOUS | Status: AC
  Filled 2021-05-04: qty 50

## 2021-05-04 MED ORDER — METHOCARBAMOL 1000 MG/10ML IJ SOLN
500.0000 mg | Freq: Four times a day (QID) | INTRAVENOUS | Status: DC | PRN
  Filled 2021-05-04: qty 5

## 2021-05-04 MED ORDER — OXYCODONE HCL 5 MG/5ML PO SOLN: 5.0000 mg | Freq: Once | ORAL | Status: DC | PRN

## 2021-05-04 MED ORDER — METOCLOPRAMIDE HCL 5 MG PO TABS: 5.0000 mg | ORAL_TABLET | Freq: Three times a day (TID) | ORAL | Status: DC | PRN

## 2021-05-04 MED ORDER — LACTATED RINGERS IV SOLN
INTRAVENOUS | Status: DC
Start: 2021-05-04 — End: 2021-05-04

## 2021-05-04 MED ORDER — DEXAMETHASONE SODIUM PHOSPHATE 10 MG/ML IJ SOLN
INTRAMUSCULAR | Status: AC
  Filled 2021-05-04: qty 1

## 2021-05-04 MED ORDER — HYDROMORPHONE HCL 1 MG/ML IJ SOLN: 0.5000 mg | INTRAMUSCULAR | Status: DC | PRN

## 2021-05-04 MED ORDER — ONDANSETRON HCL 4 MG PO TABS: 4.0000 mg | ORAL_TABLET | Freq: Four times a day (QID) | ORAL | Status: DC | PRN

## 2021-05-04 MED ORDER — POLYETHYLENE GLYCOL 3350 17 G PO PACK: 17.0000 g | PACK | Freq: Every day | ORAL | Status: DC | PRN

## 2021-05-04 MED ORDER — SODIUM CHLORIDE 0.9% FLUSH
INTRAVENOUS | Status: DC | PRN
Start: 2021-05-04 — End: 2021-05-04
  Administered 2021-05-04: 10 mL

## 2021-05-04 MED ORDER — ROPIVACAINE HCL 5 MG/ML IJ SOLN
INTRAMUSCULAR | Status: DC | PRN
  Administered 2021-05-04: 25 mL via PERINEURAL

## 2021-05-04 MED ORDER — BUPIVACAINE IN DEXTROSE 0.75-8.25 % IT SOLN
INTRATHECAL | Status: DC | PRN
  Administered 2021-05-04: 1.7 mL via INTRATHECAL

## 2021-05-04 MED ORDER — ASPIRIN 81 MG PO CHEW: 81.0000 mg | CHEWABLE_TABLET | Freq: Two times a day (BID) | ORAL | Status: DC

## 2021-05-04 MED ORDER — PANTOPRAZOLE SODIUM 40 MG PO TBEC
40.0000 mg | DELAYED_RELEASE_TABLET | Freq: Every day | ORAL | Status: DC
  Administered 2021-05-04: 40 mg via ORAL
  Filled 2021-05-04: qty 1

## 2021-05-04 MED ORDER — OXYCODONE HCL 5 MG PO TABS: 5.0000 mg | ORAL_TABLET | Freq: Four times a day (QID) | ORAL | 0 refills | Status: DC | PRN

## 2021-05-04 MED ORDER — ONDANSETRON 4 MG PO TBDP: 4.0000 mg | ORAL_TABLET | Freq: Two times a day (BID) | ORAL | 0 refills | Status: DC | PRN

## 2021-05-04 MED ORDER — DEXAMETHASONE SODIUM PHOSPHATE 10 MG/ML IJ SOLN: 10.0000 mg | Freq: Once | INTRAMUSCULAR | Status: DC

## 2021-05-04 MED ORDER — FENTANYL CITRATE PF 50 MCG/ML IJ SOSY
PREFILLED_SYRINGE | INTRAMUSCULAR | Status: AC
  Administered 2021-05-04: 50 ug via INTRAVENOUS
  Filled 2021-05-04: qty 2

## 2021-05-04 MED ORDER — METHOCARBAMOL 500 MG PO TABS: 500.0000 mg | ORAL_TABLET | Freq: Four times a day (QID) | ORAL | Status: DC | PRN

## 2021-05-04 MED ORDER — ONDANSETRON HCL 4 MG/2ML IJ SOLN: 4.0000 mg | Freq: Four times a day (QID) | INTRAMUSCULAR | Status: DC | PRN

## 2021-05-04 MED ORDER — OXYCODONE HCL 5 MG PO TABS: 5.0000 mg | ORAL_TABLET | Freq: Once | ORAL | Status: DC | PRN

## 2021-05-04 MED ORDER — ASPIRIN EC 81 MG PO TBEC
81.0000 mg | DELAYED_RELEASE_TABLET | Freq: Two times a day (BID) | ORAL | 0 refills | Status: DC
Start: 2021-05-04 — End: 2021-09-28

## 2021-05-04 MED ORDER — CEFAZOLIN SODIUM-DEXTROSE 1-4 GM/50ML-% IV SOLN
1.0000 g | Freq: Four times a day (QID) | INTRAVENOUS | Status: DC
  Administered 2021-05-04: 1 g via INTRAVENOUS
  Filled 2021-05-04: qty 50

## 2021-05-04 MED ORDER — STERILE WATER FOR IRRIGATION IR SOLN
Status: DC | PRN
  Administered 2021-05-04: 2000 mL

## 2021-05-04 MED ORDER — ACETAMINOPHEN 325 MG PO TABS: 325.0000 mg | ORAL_TABLET | Freq: Four times a day (QID) | ORAL | Status: DC | PRN

## 2021-05-04 MED ORDER — HYDROMORPHONE HCL 1 MG/ML IJ SOLN: 0.2500 mg | INTRAMUSCULAR | Status: DC | PRN

## 2021-05-04 MED ORDER — ALPRAZOLAM 0.5 MG PO TABS: 0.5000 mg | ORAL_TABLET | Freq: Every day | ORAL | Status: DC

## 2021-05-04 MED ORDER — OXYCODONE HCL 5 MG PO TABS
10.0000 mg | ORAL_TABLET | ORAL | Status: DC | PRN
  Filled 2021-05-04: qty 3

## 2021-05-04 MED ORDER — PHENOL 1.4 % MT LIQD: 1.0000 | OROMUCOSAL | Status: DC | PRN

## 2021-05-04 MED ORDER — ALUM & MAG HYDROXIDE-SIMETH 200-200-20 MG/5ML PO SUSP: 30.0000 mL | ORAL | Status: DC | PRN

## 2021-05-04 MED ORDER — LORATADINE 10 MG PO TABS: 10.0000 mg | ORAL_TABLET | Freq: Every day | ORAL | Status: DC

## 2021-05-04 MED ORDER — EPHEDRINE 5 MG/ML INJ
INTRAVENOUS | Status: AC
  Filled 2021-05-04: qty 5

## 2021-05-04 MED ORDER — MENTHOL 3 MG MT LOZG: 1.0000 | LOZENGE | OROMUCOSAL | Status: DC | PRN

## 2021-05-04 MED ORDER — ACETAMINOPHEN 500 MG PO TABS
1000.0000 mg | ORAL_TABLET | Freq: Four times a day (QID) | ORAL | Status: DC
  Filled 2021-05-04: qty 2

## 2021-05-04 MED ORDER — ONDANSETRON HCL 4 MG/2ML IJ SOLN: 4.0000 mg | Freq: Once | INTRAMUSCULAR | Status: DC | PRN

## 2021-05-04 MED ORDER — OXYCODONE HCL 5 MG PO TABS: 5.0000 mg | ORAL_TABLET | ORAL | Status: DC | PRN

## 2021-05-04 MED ORDER — DEXAMETHASONE SODIUM PHOSPHATE 10 MG/ML IJ SOLN
INTRAMUSCULAR | Status: DC | PRN
  Administered 2021-05-04: 8 mg via INTRAVENOUS

## 2021-05-04 MED ORDER — TRAMADOL HCL 50 MG PO TABS
50.0000 mg | ORAL_TABLET | Freq: Four times a day (QID) | ORAL | Status: DC
  Administered 2021-05-04: 50 mg via ORAL
  Filled 2021-05-04: qty 1

## 2021-05-04 MED ORDER — TRANEXAMIC ACID-NACL 1000-0.7 MG/100ML-% IV SOLN
1000.0000 mg | Freq: Once | INTRAVENOUS | Status: AC
  Administered 2021-05-04: 1000 mg via INTRAVENOUS
  Filled 2021-05-04: qty 100

## 2021-05-04 MED ORDER — PROPOFOL 500 MG/50ML IV EMUL
INTRAVENOUS | Status: DC | PRN
  Administered 2021-05-04: 100 ug/kg/min via INTRAVENOUS

## 2021-05-04 MED ORDER — LACTATED RINGERS IV BOLUS: 250.0000 mL | Freq: Once | INTRAVENOUS | Status: DC

## 2021-05-04 MED ORDER — ENSURE PRE-SURGERY PO LIQD
296.0000 mL | Freq: Once | ORAL | Status: AC
  Administered 2021-05-04: 296 mL via ORAL
  Filled 2021-05-04: qty 296

## 2021-05-04 MED ORDER — METHOCARBAMOL 750 MG PO TABS: 750.0000 mg | ORAL_TABLET | Freq: Three times a day (TID) | ORAL | 0 refills | Status: DC | PRN

## 2021-05-04 MED ORDER — ONDANSETRON HCL 4 MG/2ML IJ SOLN
INTRAMUSCULAR | Status: DC | PRN
Start: 2021-05-04 — End: 2021-05-04
  Administered 2021-05-04: 4 mg via INTRAVENOUS

## 2021-05-04 MED ORDER — LACTATED RINGERS IV BOLUS: 500.0000 mL | Freq: Once | INTRAVENOUS | Status: DC

## 2021-05-04 MED ORDER — ONDANSETRON HCL 4 MG/2ML IJ SOLN
INTRAMUSCULAR | Status: AC
  Filled 2021-05-04: qty 2

## 2021-05-04 MED ORDER — BUPIVACAINE LIPOSOME 1.3 % IJ SUSP
INTRAMUSCULAR | Status: AC
  Filled 2021-05-04: qty 10

## 2021-05-04 MED ORDER — BUPIVACAINE LIPOSOME 1.3 % IJ SUSP
INTRAMUSCULAR | Status: DC | PRN
  Administered 2021-05-04: 10 mL

## 2021-05-04 MED ORDER — SODIUM CHLORIDE (PF) 0.9 % IJ SOLN
INTRAMUSCULAR | Status: AC
  Filled 2021-05-04: qty 10

## 2021-05-04 MED ORDER — PROPOFOL 10 MG/ML IV BOLUS
INTRAVENOUS | Status: DC | PRN
  Administered 2021-05-04: 30 mg via INTRAVENOUS
  Administered 2021-05-04: 20 mg via INTRAVENOUS

## 2021-05-04 MED ORDER — ACETAMINOPHEN 500 MG PO TABS
1000.0000 mg | ORAL_TABLET | Freq: Four times a day (QID) | ORAL | 0 refills | Status: AC | PRN
Start: 2021-05-04 — End: ?

## 2021-05-04 MED ORDER — BISACODYL 10 MG RE SUPP: 10.0000 mg | Freq: Every day | RECTAL | Status: DC | PRN

## 2021-05-04 MED ORDER — VITAMIN D 25 MCG (1000 UNIT) PO TABS: 1000.0000 [IU] | ORAL_TABLET | Freq: Every day | ORAL | Status: DC

## 2021-05-04 MED ORDER — OYSTER SHELL CALCIUM/D3 500-5 MG-MCG PO TABS: 1.0000 | ORAL_TABLET | Freq: Every day | ORAL | Status: DC

## 2021-05-04 MED ORDER — FENTANYL CITRATE PF 50 MCG/ML IJ SOSY: 50.0000 ug | PREFILLED_SYRINGE | INTRAMUSCULAR | Status: DC

## 2021-05-04 MED ORDER — MIDAZOLAM HCL 2 MG/2ML IJ SOLN: 1.0000 mg | INTRAMUSCULAR | Status: DC

## 2021-05-04 MED ORDER — PROPOFOL 1000 MG/100ML IV EMUL
INTRAVENOUS | Status: AC
  Filled 2021-05-04: qty 100

## 2021-05-04 MED ORDER — EPHEDRINE SULFATE-NACL 50-0.9 MG/10ML-% IV SOSY
PREFILLED_SYRINGE | INTRAVENOUS | Status: DC | PRN
  Administered 2021-05-04 (×2): 5 mg via INTRAVENOUS

## 2021-05-04 MED ORDER — CALCIUM-VITAMIN D 500-3.125 MG-MCG PO TABS: 1.0000 | ORAL_TABLET | Freq: Every day | ORAL | Status: DC

## 2021-05-04 MED ORDER — MELOXICAM 15 MG PO TABS: 15.0000 mg | ORAL_TABLET | Freq: Every day | ORAL | 0 refills | Status: DC | PRN

## 2021-05-04 MED ORDER — MIDAZOLAM HCL 2 MG/2ML IJ SOLN
INTRAMUSCULAR | Status: AC
  Administered 2021-05-04: 1 mg via INTRAVENOUS
  Filled 2021-05-04: qty 2

## 2021-05-04 MED ORDER — LACTATED RINGERS IV BOLUS
250.0000 mL | Freq: Once | INTRAVENOUS | Status: AC
  Administered 2021-05-04: 250 mL via INTRAVENOUS

## 2021-05-04 MED ORDER — DIPHENHYDRAMINE HCL 12.5 MG/5ML PO ELIX: 12.5000 mg | ORAL_SOLUTION | ORAL | Status: DC | PRN

## 2021-05-04 MED ORDER — DOCUSATE SODIUM 100 MG PO CAPS: 100.0000 mg | ORAL_CAPSULE | Freq: Two times a day (BID) | ORAL | Status: DC

## 2021-05-04 SURGICAL SUPPLY — 75 items
APL PRP STRL LF DISP 70% ISPRP (MISCELLANEOUS) ×2
BAG COUNTER SPONGE SURGICOUNT (BAG) IMPLANT
BAG SPEC THK2 15X12 ZIP CLS (MISCELLANEOUS)
BAG SPNG CNTER NS LX DISP (BAG)
BAG ZIPLOCK 12X15 (MISCELLANEOUS) IMPLANT
BLADE SAG 18X100X1.27 (BLADE) ×2 IMPLANT
BLADE SAW SAG 73X25 THK (BLADE) ×1
BLADE SAW SGTL 73X25 THK (BLADE) ×1 IMPLANT
BLADE SURG SZ10 CARB STEEL (BLADE) ×6 IMPLANT
BRUSH FEMORAL CANAL (MISCELLANEOUS) IMPLANT
CHLORAPREP W/TINT 26 (MISCELLANEOUS) ×4 IMPLANT
CLSR STERI-STRIP ANTIMIC 1/2X4 (GAUZE/BANDAGES/DRESSINGS) ×2 IMPLANT
COVER PERINEAL POST (MISCELLANEOUS) ×2 IMPLANT
COVER SURGICAL LIGHT HANDLE (MISCELLANEOUS) ×4 IMPLANT
DRAPE IMP U-DRAPE 54X76 (DRAPES) ×4 IMPLANT
DRAPE INCISE IOBAN 66X45 STRL (DRAPES) ×2 IMPLANT
DRAPE ORTHO SPLIT 77X108 STRL (DRAPES) ×4
DRAPE STERI IOBAN 125X83 (DRAPES) ×2 IMPLANT
DRAPE SURG ORHT 6 SPLT 77X108 (DRAPES) ×2 IMPLANT
DRAPE U-SHAPE 47X51 STRL (DRAPES) ×6 IMPLANT
DRESSING MEPILEX FLEX 4X4 (GAUZE/BANDAGES/DRESSINGS) IMPLANT
DRSG MEPILEX BORDER 4X8 (GAUZE/BANDAGES/DRESSINGS) ×3 IMPLANT
DRSG MEPILEX FLEX 4X4 (GAUZE/BANDAGES/DRESSINGS) ×2
ELECT BLADE TIP CTD 4 INCH (ELECTRODE) ×2 IMPLANT
ELECT REM PT RETURN 15FT ADLT (MISCELLANEOUS) ×3 IMPLANT
EVACUATOR 1/8 PVC DRAIN (DRAIN) IMPLANT
FACESHIELD WRAPAROUND (MASK) ×8 IMPLANT
FACESHIELD WRAPAROUND OR TEAM (MASK) ×4 IMPLANT
GLOVE SRG 8 PF TXTR STRL LF DI (GLOVE) ×2 IMPLANT
GLOVE SURG ENC MOIS LTX SZ7.5 (GLOVE) ×4 IMPLANT
GLOVE SURG POLYISO LF SZ7.5 (GLOVE) ×4 IMPLANT
GLOVE SURG SYN 7.5  E (GLOVE) ×2
GLOVE SURG SYN 7.5 E (GLOVE) ×1 IMPLANT
GLOVE SURG SYN 7.5 PF PI (GLOVE) ×1 IMPLANT
GLOVE SURG UNDER POLY LF SZ7.5 (GLOVE) ×4 IMPLANT
GLOVE SURG UNDER POLY LF SZ8 (GLOVE) ×4
GOWN STRL REUS W/ TWL LRG LVL3 (GOWN DISPOSABLE) ×2 IMPLANT
GOWN STRL REUS W/TWL LRG LVL3 (GOWN DISPOSABLE) ×4
HEAD CERAMIC FEMORAL 36MM (Head) ×1 IMPLANT
HOLDER FOLEY CATH W/STRAP (MISCELLANEOUS) IMPLANT
INSERT 0 DEGREE 36 (Miscellaneous) ×1 IMPLANT
KIT BASIN OR (CUSTOM PROCEDURE TRAY) ×1 IMPLANT
KIT TURNOVER KIT A (KITS) IMPLANT
MANIFOLD NEPTUNE II (INSTRUMENTS) ×4 IMPLANT
NS IRRIG 1000ML POUR BTL (IV SOLUTION) ×4 IMPLANT
PACK ANTERIOR HIP CUSTOM (KITS) ×2 IMPLANT
PACK TOTAL JOINT (CUSTOM PROCEDURE TRAY) ×1 IMPLANT
PRESSURIZER FEMORAL UNIV (MISCELLANEOUS) IMPLANT
PROTECTOR NERVE ULNAR (MISCELLANEOUS) ×3 IMPLANT
RETRIEVER SUT HEWSON (MISCELLANEOUS) ×1 IMPLANT
SCREW HEX LP 6.5X20 (Screw) ×1 IMPLANT
SHELL ACETAB TRIDENT 48 (Shell) ×1 IMPLANT
SPIKE FLUID TRANSFER (MISCELLANEOUS) ×4 IMPLANT
SPONGE T-LAP 18X18 ~~LOC~~+RFID (SPONGE) ×12 IMPLANT
STAPLER VISISTAT 35W (STAPLE) ×2 IMPLANT
STEM HIP 4 127DEG (Stem) ×1 IMPLANT
STRIP CLOSURE SKIN 1/4X4 (GAUZE/BANDAGES/DRESSINGS) ×2 IMPLANT
SUCTION FRAZIER HANDLE 12FR (TUBING) ×2
SUCTION TUBE FRAZIER 12FR DISP (TUBING) ×1 IMPLANT
SUT MNCRL AB 3-0 PS2 18 (SUTURE) ×2 IMPLANT
SUT MNCRL AB 4-0 PS2 18 (SUTURE) ×2 IMPLANT
SUT VIC AB 0 CT1 36 (SUTURE) ×6 IMPLANT
SUT VIC AB 1 CT1 36 (SUTURE) ×2 IMPLANT
SUT VIC AB 1 CTX 36 (SUTURE) ×4
SUT VIC AB 1 CTX36XBRD ANBCTR (SUTURE) ×2 IMPLANT
SUT VIC AB 2-0 CT1 27 (SUTURE) ×8
SUT VIC AB 2-0 CT1 TAPERPNT 27 (SUTURE) ×4 IMPLANT
SUT VLOC 180 0 24IN GS25 (SUTURE) ×2 IMPLANT
SYR 50ML LL SCALE MARK (SYRINGE) ×2 IMPLANT
TOWEL OR 17X26 10 PK STRL BLUE (TOWEL DISPOSABLE) ×2 IMPLANT
TOWEL OR NON WOVEN STRL DISP B (DISPOSABLE) ×2 IMPLANT
TRAY CATH INTERMITTENT SS 16FR (CATHETERS) IMPLANT
TRAY FOLEY MTR SLVR 16FR STAT (SET/KITS/TRAYS/PACK) ×2 IMPLANT
TUBE SUCTION HIGH CAP CLEAR NV (SUCTIONS) ×2 IMPLANT
WATER STERILE IRR 1000ML POUR (IV SOLUTION) ×8 IMPLANT

## 2021-05-04 NOTE — Discharge Instructions (Signed)

## 2021-05-04 NOTE — Anesthesia Postprocedure Evaluation (Signed)
Anesthesia Post Note ? ?Patient: Linda Davies ? ?Procedure(s) Performed: TOTAL ANTERIOR APPROACH HIP ARTHROPLASTY WITH SCREW REMOVAL (Right: Hip) ? ?  ? ?Patient location during evaluation: PACU ?Anesthesia Type: Spinal ?Level of consciousness: oriented and awake and alert ?Pain management: pain level controlled ?Vital Signs Assessment: post-procedure vital signs reviewed and stable ?Respiratory status: spontaneous breathing, respiratory function stable and nonlabored ventilation ?Cardiovascular status: blood pressure returned to baseline and stable ?Postop Assessment: no headache, no backache, no apparent nausea or vomiting, spinal receding and patient able to bend at knees ?Anesthetic complications: no ? ? ?No notable events documented. ? ?Last Vitals:  ?Vitals:  ? 05/04/21 1315 05/04/21 1330  ?BP: (!) 110/53 (!) 110/54  ?Pulse: 82   ?Resp: (!) 26 19  ?Temp: 36.6 ?C   ?SpO2: 95% 96%  ?  ?Last Pain:  ?Vitals:  ? 05/04/21 1330  ?TempSrc:   ?PainSc: 0-No pain  ? ? ?  ?  ?  ?  ?  ?  ? ?Mishon Blubaugh A. ? ? ? ? ?

## 2021-05-04 NOTE — Progress Notes (Signed)
RNCM received call for bedside RN attempting d/c patient. Left voicemail to [facility] in [place], and did not receive call back.  ?

## 2021-05-04 NOTE — Interval H&P Note (Signed)
History and Physical Interval Note: ? ?05/04/2021 ?7:35 AM ? ?Linda Davies  has presented today for surgery, with the diagnosis of DJD RIGHT HIP.  The various methods of treatment have been discussed with the patient and family. After consideration of risks, benefits and other options for treatment, the patient has consented to  Procedure(s): ?TOTAL ANTERIOR APPROACH HIP ARTHROPLASTY (Right) as a surgical intervention.  The patient's history has been reviewed, patient examined, no change in status, stable for surgery.  I have reviewed the patient's chart and labs.  Questions were answered to the patient's satisfaction.   ? ? ?[name] ? ? ?

## 2021-05-04 NOTE — Evaluation (Addendum)
Physical Therapy Evaluation ?Patient Details ?Name: Linda Davies ?MRN: [id number] ?DOB: 07/02/1950 ?Today's Date: 05/04/2021 ? ?History of Present Illness ? 71 y.o. female with history of anxiety, GERD, tobacco dependence who presented to the orthopedic office today with increased right hip pain.  She has a history of a right hip percutaneous pinning after right hip femoral neck fracture which was performed by Dr. Mardelle Matte on 11/26/2020.  pt with  increased pain at lateral right hip and x-ray showed the femoral neck had healed shortened and that percutaneous screws were approximately 1 inch from lateral right femur.  She was scheduled for a right total hip replacement and percutaneous pin removal later this month with Dr. Mardelle Matte. X-rays were taken showing a collapsed femoral head.  Pt is s/p R DA THA per Dr. Percell Miller on 05/04/21  ?Clinical Impression ? Patient evaluated by Physical Therapy with no further acute PT needs identified. All education has been completed and the patient has no further questions.  ?Pt denies pain, amb hallway distance and reviewed stairs/mobility safety--husband present. Unsteady gait however no overt LOB, encouraged continued use of RW for safety even though hip pain improved.  Pt presents with higher level balance deficits and would benefit from f/u OPPT vs HHPT.  Recommend husband assist pt with mobility(close supervision to min/guard assist), ready to d/c from PT standpoint. ? See below for any follow-up Physical Therapy or equipment needs. PT is signing off. Thank you for this referral. ? ?   ? ?Recommendations for follow up therapy are one component of a multi-disciplinary discharge planning process, led by the attending physician.  Recommendations may be updated based on patient status, additional functional criteria and insurance authorization. ? ?Follow Up Recommendations Follow physician's recommendations for discharge plan and follow up therapies (needs HHPT vs OPPT) ? ?  ?Assistance  Recommended at Discharge  Frequent assist/supervision  ?Patient can return home with the following ? A little help with walking and/or transfers;Assist for transportation;Help with stairs or ramp for entrance ? ?  ?Equipment Recommendations None recommended by PT  ?Recommendations for Other Services ?    ?  ?Functional Status Assessment Patient has had a recent decline in their functional status and demonstrates the ability to make significant improvements in function in a reasonable and predictable amount of time.  ? ?  ?Precautions / Restrictions Precautions ?Precautions: Fall ?Restrictions ?Weight Bearing Restrictions: No ?Other Position/Activity Restrictions: WBAT  ? ?  ? ?Mobility ? Bed Mobility ?Overal bed mobility: Needs Assistance ?Bed Mobility: Supine to Sit ?  ?  ?Supine to sit: Supervision ?  ?  ?General bed mobility comments: for safety ?  ? ?Transfers ?Overall transfer level: Needs assistance ?Equipment used: Rolling walker (2 wheels) ?Transfers: Sit to/from Stand ?Sit to Stand: Supervision, Min guard ?  ?  ?  ?  ?  ?General transfer comment: cues for hand placement and overall safety. unsteady on rising with compensatory wide BOS ?  ? ?Ambulation/Gait ?Ambulation/Gait assistance: Min guard ?Gait Distance (Feet): 100 Feet ?Assistive device: Rolling walker (2 wheels) ?Gait Pattern/deviations: Step-through pattern, Decreased stride length, Wide base of support ?  ?  ?  ?General Gait Details: verbal cues for RW safety and position, cues to continue use of RW for balance/gait stability. pt is unsteady however no overt LOB ? ?Stairs ?Stairs: Yes ?Stairs assistance: Min guard, Min assist ?Stair Management: Step to pattern, Forwards ?Number of Stairs: 3 ?General stair comments: cues for sequence and safety, min-guard to min assist for balance and safety ? ?  Wheelchair Mobility ?  ? ?Modified Rankin (Stroke Patients Only) ?  ? ?  ? ?Balance   ? Sitting balance -good  ?Standing balance - fair  ?  ?  ?  ?  ?  ?  ?   ?  ?  ?  ?  ?  ?  ?  ?  ?  ?  ?   ? ? ? ?Pertinent Vitals/Pain Pain Assessment ?Pain Assessment: No/denies pain  ? ? ?Home Living Family/patient expects to be discharged to:: Private residence ?Living Arrangements: Spouse/significant other ?Available Help at Discharge: Family;Available 24 hours/day ?Type of Home: House ?  ?  ?  ?Alternate Level Stairs-Number of Steps: 1 ?Home Layout: Two level;Able to live on main level with bedroom/bathroom ?Home Equipment: Conservation officer, nature (2 wheels);Shower seat ?   ?  ?Prior Function Prior Level of Function : Independent/Modified Independent ?  ?  ?  ?  ?  ?  ?Mobility Comments: husband reports assisting prn ?  ?  ? ? ?Hand Dominance  ?   ? ?  ?Extremity/Trunk Assessment  ? Upper Extremity Assessment ?Upper Extremity Assessment: Overall WFL for tasks assessed ?  ? ?Lower Extremity Assessment ?Lower Extremity Assessment: RLE deficits/detail ?RLE Deficits / Details: grossly 3+/5 ?  ? ?   ?Communication  ? Communication: No difficulties  ?Cognition Arousal/Alertness: Awake/alert ?Behavior During Therapy: [facility] for tasks assessed/performed, Restless ?Overall Cognitive Status: Within Functional Limits for tasks assessed ?  ?  ?  ?  ?  ?  ?  ?  ?  ?  ?  ?  ?  ?  ?  ?  ?  ?  ?  ? ?  ?General Comments   ? ?  ?Exercises Total Joint Exercises ?Ankle Circles/Pumps: AROM  ? ?Assessment/Plan  ?  ?PT Assessment All further PT needs can be met in the next venue of care  ?PT Problem List   ? ?   ?  ?PT Treatment Interventions     ? ?PT Goals (Current goals can be found in the Care Plan section)  ?Acute Rehab PT Goals ?Patient Stated Goal: home today ?PT Goal Formulation: All assessment and education complete, DC therapy ? ?  ?Frequency   ?  ? ? ?Co-evaluation   ?  ?  ?  ?  ? ? ?  ?AM-PAC PT "6 Clicks" Mobility  ?Outcome Measure Help needed turning from your back to your side while in a flat bed without using bedrails?: None ?Help needed moving from lying on your back to sitting on the side of a  flat bed without using bedrails?: None ?Help needed moving to and from a bed to a chair (including a wheelchair)?: None ?Help needed standing up from a chair using your arms (e.g., wheelchair or bedside chair)?: A Little ?Help needed to walk in hospital room?: A Little ?Help needed climbing 3-5 steps with a railing? : A Little ?6 Click Score: 21 ? ?  ?End of Session Equipment Utilized During Treatment: Gait belt ?Activity Tolerance: Patient tolerated treatment well ?Patient left: in chair;with call bell/phone within reach;with family/visitor present ?Nurse Communication: Mobility status ?PT Visit Diagnosis: Unsteadiness on feet (R26.81) ?  ? ?Time: 3007-6226 ?PT Time Calculation (min) (ACUTE ONLY): 26 min ? ? ?Charges:   PT Evaluation ?$PT Eval Low Complexity: 1 Low ?PT Treatments ?$Gait Training: 8-22 mins ?  ?   ? ? ?Baxter Flattery, PT ? ?Acute Rehab Dept [facility]) [phone] ?Pager [phone] ? ?05/04/2021 ? ? ?Tien Spooner ?05/04/2021, 3:47  PM ? ?

## 2021-05-04 NOTE — Anesthesia Preprocedure Evaluation (Signed)
Anesthesia Evaluation  ?Patient identified by MRN, date of birth, ID band ?Patient awake ? ? ? ?Reviewed: ?Allergy & Precautions, NPO status , Patient's Chart, lab work & pertinent test results, reviewed documented beta blocker date and time  ? ?Airway ?Mallampati: II ? ?TM Distance: >3 FB ?Neck ROM: Full ? ? ? Dental ?no notable dental hx. ?(+) Teeth Intact ?  ?Pulmonary ?pneumonia, resolved, COPD,  COPD inhaler, Current Smoker and Patient abstained from smoking.,  ?  ?Pulmonary exam normal ?breath sounds clear to auscultation ? ? ? ? ? ? Cardiovascular ?negative cardio ROS ?Normal cardiovascular exam ?Rhythm:Regular Rate:Normal ? ? ?  ?Neuro/Psych ?Anxiety negative neurological ROS ?   ? GI/Hepatic ?Neg liver ROS, GERD  Medicated,  ?Endo/Other  ?negative endocrine ROS ? Renal/GU ?negative Renal ROS  ?negative genitourinary ?  ?Musculoskeletal ? ?(+) Arthritis , Osteoarthritis,  Right femoral neck Fx  ? Abdominal ?  ?Peds ? Hematology ? ?(+) Blood dyscrasia, anemia ,   ?Anesthesia Other Findings ? ? Reproductive/Obstetrics ? ?  ? ? ? ? ? ? ? ? ? ? ? ? ? ?  ?  ? ? ? ? ? ? ? ? ?Anesthesia Physical ?Anesthesia Plan ? ?ASA: 3 ? ?Anesthesia Plan: Spinal  ? ?Post-op Pain Management:   ? ?Induction: Intravenous ? ?PONV Risk Score and Plan: 2 and Treatment may vary due to age or medical condition, Propofol infusion and Ondansetron ? ?Airway Management Planned: Natural Airway and Simple Face Mask ? ?Additional Equipment:  ? ?Intra-op Plan:  ? ?Post-operative Plan:  ? ?Informed Consent: I have reviewed the patients History and Physical, chart, labs and discussed the procedure including the risks, benefits and alternatives for the proposed anesthesia with the patient or authorized representative who has indicated his/her understanding and acceptance.  ? ? ? ?Dental advisory given ? ?Plan Discussed with: CRNA and Anesthesiologist ? ?Anesthesia Plan Comments:   ? ? ? ? ? ? ?Anesthesia Quick  Evaluation ? ?

## 2021-05-04 NOTE — Progress Notes (Signed)
AssistedDr. Royce Macadamia with right, pericapsular nerve group (PENG) ultrasound guided  block. Side rails up, monitors on throughout procedure. See vital signs in flow sheet. Tolerated Procedure well. ? ?

## 2021-05-04 NOTE — Transfer of Care (Signed)
Immediate Anesthesia Transfer of Care Note ? ?Patient: Linda Davies ? ?Procedure(s) Performed: TOTAL ANTERIOR APPROACH HIP ARTHROPLASTY WITH SCREW REMOVAL (Right: Hip) ? ?Patient Location: PACU ? ?Anesthesia Type:Spinal ? ?Level of Consciousness: awake and patient cooperative ? ?Airway & Oxygen Therapy: Patient Spontanous Breathing and Patient connected to face mask ? ?Post-op Assessment: Report given to RN and Post -op Vital signs reviewed and stable ? ?Post vital signs: Reviewed and stable ? ?Last Vitals:  ?Vitals Value Taken Time  ?BP 95/61 05/04/21 1237  ?Temp    ?Pulse 85 05/04/21 1237  ?Resp 20 05/04/21 1239  ?SpO2 96 % 05/04/21 1237  ?Vitals shown include unvalidated device data. ? ?Last Pain:  ?Vitals:  ? 05/04/21 0554  ?TempSrc: Oral  ?PainSc:   ?   ? ?Patients Stated Pain Goal: 2 (05/04/21 0025) ? ?Complications: No notable events documented. ?

## 2021-05-04 NOTE — Anesthesia Procedure Notes (Signed)
Spinal ? ?Patient location during procedure: OR ?Start time: 05/04/2021 10:26 AM ?End time: 05/04/2021 10:29 AM ?Reason for block: surgical anesthesia ?Staffing ?Performed: anesthesiologist  ?Anesthesiologist: Josephine Igo, MD ?Preanesthetic Checklist ?Completed: patient identified, IV checked, site marked, risks and benefits discussed, surgical consent, monitors and equipment checked, pre-op evaluation and timeout performed ?Spinal Block ?Patient position: sitting ?Prep: DuraPrep and site prepped and draped ?Patient monitoring: heart rate, cardiac monitor, continuous pulse ox and blood pressure ?Approach: midline ?Location: L3-4 ?Injection technique: single-shot ?Needle ?Needle type: Pencan  ?Needle gauge: 24 G ?Needle length: 9 cm ?Needle insertion depth: 6 cm ?Assessment ?Sensory level: T4 ?Events: CSF return ?Additional Notes ?Patient tolerated procedure well. Adequate sensory level. ? ? ? ? ? ?

## 2021-05-04 NOTE — Progress Notes (Signed)
Patient discharged to home w/ family. Given all belongings, instructions. Verbalized understanding of instructions. Escorted to pov via w/c. 

## 2021-05-04 NOTE — Interval H&P Note (Signed)
History and Physical Interval Note: ? ?05/04/2021 ?10:16 AM ? ?Linda Davies  has presented today for surgery, with the diagnosis of DJD RIGHT HIP.  The various methods of treatment have been discussed with the patient and family. After consideration of risks, benefits and other options for treatment, the patient has consented to  Procedure(s): ?[place] (Right) as a surgical intervention.  The patient's history has been reviewed, patient examined, no change in status, stable for surgery.  I have reviewed the patient's chart and labs.  Questions were answered to the patient's satisfaction.   ? ? ?[name] ? ? ?

## 2021-05-04 NOTE — Anesthesia Procedure Notes (Signed)
Procedure Name: [place] ?Date/Time: 05/04/2021 10:35 AM ?Performed by: Claudia Desanctis, CRNA ?Pre-anesthesia Checklist: Patient identified, Emergency Drugs available, Suction available and Patient being monitored ?Patient Re-evaluated:Patient Re-evaluated prior to induction ?Oxygen Delivery Method: Simple face mask ? ? ? ? ?

## 2021-05-04 NOTE — Anesthesia Procedure Notes (Signed)
Anesthesia Regional Block: Peng block  ? ?Pre-Anesthetic Checklist: , timeout performed,  Correct Patient, Correct Site, Correct Laterality,  Correct Procedure, Correct Position, site marked,  Risks and benefits discussed,  Surgical consent,  Pre-op evaluation,  At surgeon's request and post-op pain management ? ?Laterality: Right ? ?Prep: chloraprep     ?  ?Needles:  ?Injection technique: Single-shot ? ?Needle Type: Echogenic Stimulator Needle   ? ? ?Needle Length: 10cm  ?Needle Gauge: 21  ? ?Needle insertion depth: 7 cm ? ? ?Additional Needles: ? ? ?Procedures:,,,, ultrasound used (permanent image in chart),,    ?Narrative:  ?Start time: 05/04/2021 9:03 AM ?End time: 05/04/2021 9:08 AM ?Injection made incrementally with aspirations every 5 mL. ? ?Performed by: Personally  ?Anesthesiologist: Josephine Igo, MD ? ?Additional Notes: ?Timeout performed. Patient sedated. Relevant anatomy ID'd using [place]. Incremental 2-75m injection of LA with frequent aspiration. Patient tolerated procedure well. ? ? ? ?Right PENG Block ? ?

## 2021-05-04 NOTE — Discharge Summary (Signed)
Physician Discharge Summary  ?Patient ID: ?Linda Davies ?MRN: [id number] ?DOB/AGE: [birth year] 71 y.o. ? ?Admit date: 05/03/2021 ?Discharge date: 05/05/2021 ? ?Admission Diagnoses: right femoral head collapse ? ?Discharge Diagnoses:  ?Principal Problem: ?  S/P total right hip arthroplasty ?Active Problems: ?  Fracture of femoral head ([place]) ?  Closed fracture of right femur ([place]) ? ? ?Discharged Condition: fair ? ?Hospital Course: Patient underwent a right hip hardware removal followed by a Total Hip Arthroplasty due to femoral head collapse and non-union of previous fracture by Dr. Percell Miller on 07/31/04 without complications. Her pain is will controlled and she has passed her PT evaluation. She is ready to go home with OPPT already set up.  ? ?Consults: None ? ?Significant Diagnostic Studies: n/a ? ?Treatments: IV hydration, antibiotics: Ancef, analgesia: acetaminophen, Vicodin, Dilaudid, Morphine, and Oxycodone, anticoagulation: ASA, therapies: PT and OT, and surgery: right THA ? ?Discharge Exam: ?Blood pressure 118/70, pulse 95, temperature 97.8 ?F (36.6 ?C), resp. rate 16, height '5\' 5"'$  (1.651 m), weight 59.9 kg, SpO2 97 %. ?General appearance: alert, cooperative, and no distress ?Head: Normocephalic, without obvious abnormality, atraumatic ?Resp: clear to auscultation bilaterally ?Cardio: regular rate and rhythm, S1, S2 normal, no murmur, click, rub or gallop ?GI: soft, non-tender; bowel sounds normal; no masses,  no organomegaly ?Extremities: extremities normal, atraumatic, no cyanosis or edema ?Pulses:  ?L brachial 2+ R brachial 2+  ?L radial 2+ R radial 2+  ?L inguinal 2+ R inguinal 2+  ?L popliteal 2+ R popliteal 2+  ?L posterior tibial 2+ R posterior tibial 2+  ?L dorsalis pedis 2+ R dorsalis pedis 2+  ? ?Neurologic: Grossly normal ?Incision/Wound: c/d/i ? ?Disposition: Discharge disposition: 01-Home or Self Care ? ? ? ? ? ? ?Discharge Instructions   ? ? Call MD / Call 911   Complete by: As directed ?  ? If you  experience chest pain or shortness of breath, CALL 911 and be transported to the hospital emergency room.  If you develope a fever above 101 F, pus (white drainage) or increased drainage or redness at the wound, or calf pain, call your surgeon's office.  ? Diet - low sodium heart healthy   Complete by: As directed ?  ? Discharge instructions   Complete by: As directed ?  ? You may bear weight as tolerated. ?Keep your dressing on and dry until follow up. ?Take medicine to prevent blood clots as directed. ?Take pain medicine as needed with the goal of transitioning to over the counter medicines.  ? ? ?INSTRUCTIONS AFTER JOINT REPLACEMENT  ? ?Remove items at home which could result in a fall. This includes throw rugs or furniture in walking pathways ?ICE to the affected joint every three hours while awake for 30 minutes at a time, for at least the first 3-5 days, and then as needed for pain and swelling.  Continue to use ice for pain and swelling. You may notice swelling that will progress down to the foot and ankle.  This is normal after surgery.  Elevate your leg when you are not up walking on it.   ?Continue to use the breathing machine you got in the hospital (incentive spirometer) which will help keep your temperature down.  It is common for your temperature to cycle up and down following surgery, especially at night when you are not up moving around and exerting yourself.  The breathing machine keeps your lungs expanded and your temperature down. ? ? ?DIET:  As you were doing prior to  hospitalization, we recommend a well-balanced diet. ? ?DRESSING / WOUND CARE / SHOWERING ? ?You may shower 3 days after surgery, but keep the wounds dry during showering.  You may use an occlusive plastic wrap (Press'n Seal for example) with blue painter's tape at edges, NO SOAKING/SUBMERGING IN THE BATHTUB.  If the bandage gets wet, call the office.  ? ?ACTIVITY ? ?Increase activity slowly as tolerated, but follow the weight  bearing instructions below.   ?No driving for 6 weeks or until further direction given by your physician.  You cannot drive while taking narcotics.  ?No lifting or carrying greater than 10 lbs. until further directed by your surgeon. ?Avoid periods of inactivity such as sitting longer than an hour when not asleep. This helps prevent blood clots.  ?You may return to work once you are authorized by your doctor.  ? ? ?WEIGHT BEARING  ? ?Weight bearing as tolerated with assist device (walker, cane, etc) as directed, use it as long as suggested by your surgeon or therapist, typically at least 4-6 weeks. ? ? ?EXERCISES ? ?Results after joint replacement surgery are often greatly improved when you follow the exercise, range of motion and muscle strengthening exercises prescribed by your doctor. Safety measures are also important to protect the joint from further injury. Any time any of these exercises cause you to have increased pain or swelling, decrease what you are doing until you are comfortable again and then slowly increase them. If you have problems or questions, call your caregiver or physical therapist for advice.  ? ?Rehabilitation is important following a joint replacement. After just a few days of immobilization, the muscles of the leg can become weakened and shrink (atrophy).  These exercises are designed to build up the tone and strength of the thigh and leg muscles and to improve motion. Often times heat used for twenty to thirty minutes before working out will loosen up your tissues and help with improving the range of motion but do not use heat for the first two weeks following surgery (sometimes heat can increase post-operative swelling).  ? ?These exercises can be done on a training (exercise) mat, on the floor, on a table or on a bed. Use whatever works the best and is most comfortable for you.    Use music or television while you are exercising so that the exercises are a pleasant break in your day.  This will make your life better with the exercises acting as a break in your routine that you can look forward to.   Perform all exercises about fifteen times, three times per day or as directed.  You should exercise both the operative leg and the other leg as well. ? ?Exercises include: ?  ?Quad Sets - Tighten up the muscle on the front of the thigh (Quad) and hold for 5-10 seconds.   ?Straight Leg Raises - With your knee straight (if you were given a brace, keep it on), lift the leg to 60 degrees, hold for 3 seconds, and slowly lower the leg.  Perform this exercise against resistance later as your leg gets stronger.  ?Leg Slides: Lying on your back, slowly slide your foot toward your buttocks, bending your knee up off the floor (only go as far as is comfortable). Then slowly slide your foot back down until your leg is flat on the floor again.  ?Angel Wings: Lying on your back spread your legs to the side as far apart as you can without causing discomfort.  ?  Hamstring Strength:  Lying on your back, push your heel against the floor with your leg straight by tightening up the muscles of your buttocks.  Repeat, but this time bend your knee to a comfortable angle, and push your heel against the floor.  You may put a pillow under the heel to make it more comfortable if necessary.  ? ?A rehabilitation program following joint replacement surgery can speed recovery and prevent re-injury in the future due to weakened muscles. Contact your doctor or a physical therapist for more information on knee rehabilitation.  ? ? ?CONSTIPATION ? ?Constipation is defined medically as fewer than three stools per week and severe constipation as less than one stool per week.  Even if you have a regular bowel pattern at home, your normal regimen is likely to be disrupted due to multiple reasons following surgery.  Combination of anesthesia, postoperative narcotics, change in appetite and fluid intake all can affect your bowels.  ? ?YOU MUST  use at least one of the following options; they are listed in order of increasing strength to get the job done.  They are all available over the counter, and you may need to use some, POSSIBLY even all of these optio

## 2021-05-05 ENCOUNTER — Encounter (HOSPITAL_COMMUNITY): Payer: Self-pay | Admitting: Orthopedic Surgery

## 2021-05-06 NOTE — Op Note (Signed)
05/04/2021 ? ?6:44 AM ? ?PATIENT:  Linda Davies  ? ?MRN: [id number] ? ?PRE-OPERATIVE DIAGNOSIS:  DJD RIGHT HIP ? ?POST-OPERATIVE DIAGNOSIS:  DJD RIGHT HIP ? ?PROCEDURE:  Procedure(s): ?TOTAL ANTERIOR APPROACH HIP ARTHROPLASTY WITH SCREW REMOVAL ? ?PREOPERATIVE INDICATIONS:   ? ?Linda Davies is an 71 y.o. female who has a diagnosis of S/P total right hip arthroplasty and elected for surgical management after failing conservative treatment.  The risks benefits and alternatives were discussed with the patient including but not limited to the risks of nonoperative treatment, versus surgical intervention including infection, bleeding, nerve injury, periprosthetic fracture, the need for revision surgery, dislocation, leg length discrepancy, blood clots, cardiopulmonary complications, morbidity, mortality, among others, and they were willing to proceed.   ? ? ?OPERATIVE REPORT  ?   ?SURGEON:   [name], MD ?   ?ASSISTANT:  Aggie Moats, PA-C, he was present and scrubbed throughout the case, critical for completion in a timely fashion, and for retraction, instrumentation, and closure. ? ?   ?ANESTHESIA:  General ?   ?COMPLICATIONS:  None.  ?   ?COMPONENTS:  [employer]ker acolade fit femur size 4 with a 36 mm 0 head ball and an acetabular shell size 48 with a  polyethylene liner ?   ?PROCEDURE IN DETAIL:  ? ?The patient was met in the holding area and  identified.  The appropriate hip was identified and marked at the operative site.  The patient was then transported to the OR  and  placed under anesthesia per that record.  At that point, the patient was  placed in the supine position and  secured to the operating room table and all bony prominences padded. He received pre-operative antibiotics ?   ?The operative lower extremity was prepped from the iliac crest to the distal leg.  Sterile draping was performed.  Time out was performed prior to incision.   ? ?I made an incision via her previous surgical incision. I then used  flouroscopy to place a guide pin in the screw heads and was able to remove these whole. 4+fluoroscopy views confirmed screw removal. I then irrigated and closed this incision.  ?   ?Skin incision was made just 2 cm lateral to the ASIS  extending in line with the tensor fascia lata. Electrocautery was used to control all bleeders. I dissected down sharply to the fascia of the tensor fascia lata was confirmed that the muscle fibers beneath were running posteriorly. I then incised the fascia over the superficial tensor fascia lata in line with the incision. The fascia was elevated off the anterior aspect of the muscle the muscle was retracted posteriorly and protected throughout the case. I then used electrocautery to incise the tensor fascia lata fascia control and all bleeders. Immediately visible was the fat over top of the anterior neck and capsule. ? ?I removed the anterior fat from the capsule and elevated the rectus muscle off of the anterior capsule. I then removed a large time of capsule. The retractors were then placed over the anterior acetabulum as well as around the superior and inferior neck. ? ?I then made a femoral neck cut. Then used the power corkscrew to remove the femoral head from the acetabulum and thoroughly irrigated the acetabulum. I sized the femoral head. ?   ?I then exposed the deep acetabulum, cleared out any tissue including the ligamentum teres.   After adequate visualization, I excised the labrum, and then sequentially reamed.  I then impacted the acetabular implant into  place using fluoroscopy for guidance.  Appropriate version and inclination was confirmed clinically matching their bony anatomy, and with fluoroscopy. ? ?I placed a 20 mm screw in the posterior/superio position with an excellent bite.  ? ? I then placed the polyethylene liner in place ? ?I then adducted the leg and released the external rotators from the posterior femur allowing it to be easily delivered up lateral and  anterior to the acetabulum for preparation of the femoral canal. ?   ?I then prepared the proximal femur using the cookie-cutter and then sequentially reamed and broached. ? ?A trial broach, neck, and head was utilized, and I reduced the hip and used floroscopy to assess the neck length and femoral implant. ? ?I then impacted the femoral prosthesis into place into the appropriate version. The hip was then reduced and fluoroscopy confirmed appropriate position. Leg lengths were restored. ? ?I then irrigated the hip copiously again with, and repaired the fascia with Vicryl, followed by monocryl for the subcutaneous tissue, Monocryl for the skin, Steri-Strips and sterile gauze. The patient was then awakened and returned to PACU in stable and satisfactory condition. There were no complications. ? ?POST OPERATIVE PLAN: WBAT, DVT px: SCD's/TED, ambulation and chemical dvt px ? ?Edmonia Lynch, MD ?Orthopedic Surgeon ?[phone]  ? ? ? ?

## 2021-05-12 ENCOUNTER — Encounter (HOSPITAL_COMMUNITY): Payer: Medicare PPO

## 2021-05-17 DIAGNOSIS — M1611 Unilateral primary osteoarthritis, right hip: Secondary | ICD-10-CM | POA: Diagnosis not present

## 2021-05-19 ENCOUNTER — Other Ambulatory Visit: Payer: Self-pay | Admitting: Family Medicine

## 2021-05-20 NOTE — Telephone Encounter (Signed)
Requesting: alprazolam ?Contract: 04/01/20 ?UDS:11/03/20 ?Last Visit: 04/09/21 ?Next Visit: N/A ?Last Refill: 04/21/21(60,5) ? ?Refill on file at the pharmacy ? ?

## 2021-05-26 DIAGNOSIS — R262 Difficulty in walking, not elsewhere classified: Secondary | ICD-10-CM | POA: Diagnosis not present

## 2021-05-26 DIAGNOSIS — M1611 Unilateral primary osteoarthritis, right hip: Secondary | ICD-10-CM | POA: Diagnosis not present

## 2021-05-26 DIAGNOSIS — M6281 Muscle weakness (generalized): Secondary | ICD-10-CM | POA: Diagnosis not present

## 2021-05-26 DIAGNOSIS — Z96641 Presence of right artificial hip joint: Secondary | ICD-10-CM | POA: Diagnosis not present

## 2021-06-02 DIAGNOSIS — M6281 Muscle weakness (generalized): Secondary | ICD-10-CM | POA: Diagnosis not present

## 2021-06-02 DIAGNOSIS — Z96641 Presence of right artificial hip joint: Secondary | ICD-10-CM | POA: Diagnosis not present

## 2021-06-02 DIAGNOSIS — M1611 Unilateral primary osteoarthritis, right hip: Secondary | ICD-10-CM | POA: Diagnosis not present

## 2021-06-02 DIAGNOSIS — R262 Difficulty in walking, not elsewhere classified: Secondary | ICD-10-CM | POA: Diagnosis not present

## 2021-06-09 DIAGNOSIS — M6281 Muscle weakness (generalized): Secondary | ICD-10-CM | POA: Diagnosis not present

## 2021-06-09 DIAGNOSIS — M1611 Unilateral primary osteoarthritis, right hip: Secondary | ICD-10-CM | POA: Diagnosis not present

## 2021-06-09 DIAGNOSIS — R262 Difficulty in walking, not elsewhere classified: Secondary | ICD-10-CM | POA: Diagnosis not present

## 2021-06-09 DIAGNOSIS — Z96641 Presence of right artificial hip joint: Secondary | ICD-10-CM | POA: Diagnosis not present

## 2021-07-26 ENCOUNTER — Telehealth: Payer: Self-pay

## 2021-07-26 NOTE — Telephone Encounter (Signed)
LM for pt regarding breast cancer screening. Order placed on 04/09/21 for mammogram. Would like to confirm if completed.

## 2021-09-27 ENCOUNTER — Encounter (HOSPITAL_COMMUNITY): Payer: Self-pay | Admitting: Orthopedic Surgery

## 2021-09-27 ENCOUNTER — Other Ambulatory Visit: Payer: Self-pay

## 2021-09-27 DIAGNOSIS — M25552 Pain in left hip: Secondary | ICD-10-CM | POA: Diagnosis not present

## 2021-09-27 NOTE — Progress Notes (Signed)
Spoke with pt for pre-op call. Pt denies cardiac history, HTN or Diabetes.   Pt unable to come to the hospital to pick up the CHG soap but she plans to have her husband pick some up at the drugstore when he gets some prescriptions filled. I gave her the instructions for the soap verbally.

## 2021-09-27 NOTE — H&P (Signed)
HIP ARTHROPLASTY ADMISSION H&P  Patient ID: Linda Davies MRN: [id number] DOB/AGE: February 14, 1950 71 y.o.  Chief Complaint: left hip fracture  Planned Procedure Date: 09/28/21   HPI: Linda Davies is a 71 y.o. female who presents for evaluation of LEFT HIP FRACTURE. The patient has a history of pain and functional disability in the left hip. She fell a few days ago while playing with her grandchildren. She had immediate pain and difficulty weight bearing since. The patient noted no past surgery on the left hip.  Patient currently rates pain at 10 out of 10 with activity. Patient has night pain, worsening of pain with activity and weight bearing, and pain that interferes with activities of daily living.  Patient has evidence of subchondral sclerosis, periarticular osteophytes, joint space narrowing, and and femoral neck fracture  by imaging studies.  There is no active infection.  Past Medical History:  Diagnosis Date   Allergic rhinitis    Anxiety    Arthritis    low back and both hands   Basal cell carcinoma    forehead - removed in office   COPD (chronic obstructive pulmonary disease) ([place])    asymptomatic BUT has purse-lipped breathing on exam even when feeling well   COVID 2021   on prednisone  and antibiotics   Gastritis 08/17/2017   EGD   GERD (gastroesophageal reflux disease)    Iron deficiency anemia 05/2017     Hemoccults neg 05/26/17.  Colonoscopy -->no culprit.  EGD--> gastritis (h pyl NEG).   Melanoma ([place])    Near umbilicus. close f/u with Dr. Delman Cheadle (annual)   Pneumonia    x 1   S/P right hip fracture 10/2020   Tobacco dependence    Past Surgical History:  Procedure Laterality Date   CESAREAN SECTION  1981   spinal   COLONOSCOPY  08/17/2017   Done for unexplained IDA-->Diverticulosis, non-bleeding internal hem, o/w normal.  Repeat 10 yrs.   ESOPHAGOGASTRODUODENOSCOPY  08/17/2017   +Gastritis.  Gastric bx "reactive gastritis".  Bx for H pylori NEG.        Duodenum bx:  NORMAL.   EYE SURGERY Left    lasik   HIP PINNING,CANNULATED Right 11/26/2020   Procedure: PERCUTANEOUS PINNING EXTREMITYproximal femur;  Surgeon: Marchia Bond, MD;  Location: [facility];  Service: Orthopedics;  Laterality: Right;   MELANOMA EXCISION     abd midline below belly button   TOTAL HIP ARTHROPLASTY Right 05/04/2021   Procedure: TOTAL ANTERIOR APPROACH HIP ARTHROPLASTY WITH SCREW REMOVAL;  Surgeon: [name], MD;  Location: [facility];  Service: Orthopedics;  Laterality: Right;   Allergies  Allergen Reactions   Latex Rash   Prior to Admission medications   Medication Sig Start Date End Date Taking? Authorizing Provider  acetaminophen (TYLENOL) 500 MG tablet Take 2 tablets (1,000 mg total) by mouth every 6 (six) hours as needed for moderate pain or mild pain. 05/04/21   McBane, Maylene Roes, PA-C  ALPRAZolam (XANAX) 0.5 MG tablet TAKE 1/2 TO 1 TABLET BY MOUTH TWICE DAILY AS NEEDED FOR ANXIETY Patient taking differently: Take 0.5 mg by mouth at bedtime. 04/21/21   McGowen, Adrian Blackwater, MD  aspirin EC 81 MG tablet Take 1 tablet (81 mg total) by mouth 2 (two) times daily. For DVT prophylaxis for 30 days after surgery. 05/04/21   McBane, Maylene Roes, PA-C  Biotin w/ Vitamins C & E (HAIR/SKIN/NAILS PO) Take 1 tablet by mouth daily.    [provider]  Calcium-Vitamin D 500-3.125 MG-MCG  TABS Take 1 tablet by mouth daily.    [provider]  cetirizine (ZYRTEC) 10 MG tablet Take 10 mg by mouth in the morning.    [provider]  cholecalciferol (VITAMIN D) 1000 units tablet Take 1,000 Units by mouth daily.    [provider]  meloxicam (MOBIC) 15 MG tablet Take 1 tablet (15 mg total) by mouth daily as needed for pain (and inflammation). 05/04/21   McBane, Maylene Roes, PA-C  methocarbamol (ROBAXIN-750) 750 MG tablet Take 1 tablet (750 mg total) by mouth every 8 (eight) hours as needed for muscle spasms. 05/04/21   McBane, Maylene Roes, PA-C  ondansetron (ZOFRAN-ODT) 4 MG  disintegrating tablet Take 1 tablet (4 mg total) by mouth 2 (two) times daily as needed for nausea or vomiting. 05/04/21   McBane, Maylene Roes, PA-C  oxyCODONE (ROXICODONE) 5 MG immediate release tablet Take 1 tablet (5 mg total) by mouth every 6 (six) hours as needed for severe pain (after hip surgery). Do not take more than 6 tablets in a 24 hour period. 05/04/21   McBane, Maylene Roes, PA-C  pantoprazole (PROTONIX) 40 MG tablet TAKE 1 TABLET(40 MG) BY MOUTH DAILY 11/19/20   McGowen, Adrian Blackwater, MD  sennosides-docusate sodium (SENOKOT-S) 8.6-50 MG tablet Take 2 tablets by mouth daily. Patient not taking: Reported on 05/03/2021 11/26/20   Ventura Bruns, PA-C  Turmeric 500 MG CAPS Take 1 capsule by mouth daily.    [provider]  vitamin C (ASCORBIC ACID) 500 MG tablet Take 500 mg by mouth daily.    [provider]   Social History   Socioeconomic History   Marital status: Married    Spouse name: Not on file   Number of children: Not on file   Years of education: Not on file   Highest education level: Bachelor's degree (e.g., BA, AB, BS)  Occupational History   Occupation: Teacher, early years/pre  Tobacco Use   Smoking status: Every Day    Packs/day: 0.50    Years: 30.00    Total pack years: 15.00    Types: Cigarettes   Smokeless tobacco: Never  Vaping Use   Vaping Use: Former  Substance and Sexual Activity   Alcohol use: Yes    Alcohol/week: 7.0 standard drinks of alcohol    Types: 7 Glasses of wine per week    Comment: occasional wine   Drug use: Never   Sexual activity: Yes    Partners: Male    Birth control/protection: Post-menopausal  Other Topics Concern   Not on file  Social History Narrative   Married (her pt is a husband of mine as well), 3 children, 1 grand child.   Educ: college.   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   Tob: current as of 05/2017; 15 pack-yr hx.   [id number]   Social Determinants of Health   Financial Resource Strain: Low Risk   (04/10/2021)   Overall Financial Resource Strain (CARDIA)    Difficulty of Paying Living Expenses: Not hard at all  Food Insecurity: No Food Insecurity (04/10/2021)   Hunger Vital Sign    Worried About Running Out of Food in the Last Year: Never true    Ran Out of Food in the Last Year: Never true  Transportation Needs: No Transportation Needs (04/10/2021)   PRAPARE - Hydrologist (Medical): No    Lack of Transportation (Non-Medical): No  Physical Activity: Insufficiently Active (04/10/2021)   Exercise Vital Sign  Days of Exercise per Week: 1 day    Minutes of Exercise per Session: 10 min  Stress: Stress Concern Present (04/10/2021)   [place]    Feeling of Stress : To some extent  Social Connections: Moderately Integrated (04/10/2021)   Social Connection and Isolation Panel [NHANES]    Frequency of Communication with Friends and Family: More than three times a week    Frequency of Social Gatherings with Friends and Family: Three times a week    Attends Religious Services: 1 to 4 times per year    Active Member of Clubs or Organizations: No    Attends Archivist Meetings: Never    Marital Status: Married   Family History  Problem Relation Age of Onset   Arthritis Mother    Asthma Mother    COPD Mother    Hypertension Mother    Cancer Father        liver ca   Alcohol abuse Father    Alzheimer's disease Father    Cancer Brother        Leukemia 2017   Hearing loss Brother    Early death Brother    Alcohol abuse Maternal Grandmother    Cancer Maternal Grandmother    Cancer Maternal Grandfather        lung   Alzheimer's disease Paternal Grandmother    Cancer Paternal Grandfather        lung   Asthma Daughter     ROS: Currently denies lightheadedness, dizziness, Fever, chills, CP, SOB.   No personal history of DVT, PE, MI, or CVA. All other systems have been reviewed  and were otherwise currently negative with the exception of those mentioned in the HPI and as above.  Objective:  MSK/Surgical Site: + TTP. Decreased hip ROM d/t pain. + [place]. + SLR. + FABER/FADIR. Decreased strength.  NVI.    Imaging Review Plain radiographs demonstrate a left femoral neck fracture.  The bone quality appears to be poor for age and reported activity level. She has now have fractures in both hips. The right hip had to be replaced in April 2023 due to fracture.   Preoperative templating of the joint replacement has been completed, documented, and submitted to the Operating Room personnel in order to optimize intra-operative equipment management.  Assessment: LEFT HIP FRACTURE Active Problems:   * No active hospital problems. *   Plan: Plan for Procedure(s): LEFT TOTAL HIP ARTHROPLASTY ANTERIOR APPROACH  The patient history, physical exam, clinical judgement of the provider and imaging are consistent with end stage degenerative joint disease and total joint arthroplasty is deemed medically necessary. The treatment options including medical management, injection therapy, and arthroplasty were discussed at length. The risks and benefits of Procedure(s): TOTAL HIP ARTHROPLASTY ANTERIOR APPROACH were presented and reviewed.  The risks of nonoperative treatment, versus surgical intervention including but not limited to continued pain, aseptic loosening, stiffness, dislocation/subluxation, infection, bleeding, nerve injury, blood clots, cardiopulmonary complications, morbidity, mortality, among others were discussed. The patient verbalizes understanding and wishes to proceed with the plan.  Patient is being admitted for surgery, pain control, PT, prophylactic antibiotics, VTE prophylaxis, progressive ambulation, ADL's and discharge planning.   Dental prophylaxis discussed and recommended for 2 years postoperatively.  The patient does meet the criteria for TXA which will  be used perioperatively.   ASA 81 mg BID will be used postoperatively for DVT prophylaxis in addition to SCDs, and early ambulation. Plan for Oxycodone, Mobic,  Tylenol for pain.   Robaxin for muscle spasm.  Zofran for nausea and vomiting. Colace for constipation prevention Pharmacy- [facility] on [address] in [place] The patient is planning to be discharged home with OPPT and into the care of her husband Antony Haste who can be reached at [phone] Follow up appt 10/13/21 at 9:45am     Alisa Graff Office [phone] 09/27/2021 5:15 PM

## 2021-09-28 ENCOUNTER — Ambulatory Visit (HOSPITAL_COMMUNITY): Payer: Medicare PPO | Admitting: Certified Registered Nurse Anesthetist

## 2021-09-28 ENCOUNTER — Ambulatory Visit (HOSPITAL_BASED_OUTPATIENT_CLINIC_OR_DEPARTMENT_OTHER): Payer: Medicare PPO | Admitting: Certified Registered Nurse Anesthetist

## 2021-09-28 ENCOUNTER — Ambulatory Visit (HOSPITAL_COMMUNITY)
Admission: RE | Admit: 2021-09-28 | Discharge: 2021-09-28 | Disposition: A | Discharge status: Home / Self Care | Payer: Medicare PPO | Attending: Orthopedic Surgery | Admitting: Orthopedic Surgery

## 2021-09-28 ENCOUNTER — Ambulatory Visit (HOSPITAL_COMMUNITY): Payer: Medicare PPO

## 2021-09-28 ENCOUNTER — Encounter (HOSPITAL_COMMUNITY): Payer: Self-pay | Admitting: Orthopedic Surgery

## 2021-09-28 ENCOUNTER — Encounter (HOSPITAL_COMMUNITY)
Admission: RE | Disposition: A | Discharge status: Home / Self Care | Payer: Self-pay | Source: Home / Self Care | Attending: Orthopedic Surgery

## 2021-09-28 DIAGNOSIS — S72092A Other fracture of head and neck of left femur, initial encounter for closed fracture: Secondary | ICD-10-CM | POA: Insufficient documentation

## 2021-09-28 DIAGNOSIS — S72002A Fracture of unspecified part of neck of left femur, initial encounter for closed fracture: Secondary | ICD-10-CM | POA: Diagnosis present

## 2021-09-28 DIAGNOSIS — Z471 Aftercare following joint replacement surgery: Secondary | ICD-10-CM | POA: Diagnosis not present

## 2021-09-28 DIAGNOSIS — F17210 Nicotine dependence, cigarettes, uncomplicated: Secondary | ICD-10-CM | POA: Diagnosis not present

## 2021-09-28 DIAGNOSIS — K219 Gastro-esophageal reflux disease without esophagitis: Secondary | ICD-10-CM | POA: Diagnosis not present

## 2021-09-28 DIAGNOSIS — Z96642 Presence of left artificial hip joint: Secondary | ICD-10-CM | POA: Diagnosis not present

## 2021-09-28 DIAGNOSIS — M1612 Unilateral primary osteoarthritis, left hip: Secondary | ICD-10-CM | POA: Diagnosis not present

## 2021-09-28 DIAGNOSIS — M1990 Unspecified osteoarthritis, unspecified site: Secondary | ICD-10-CM | POA: Diagnosis not present

## 2021-09-28 DIAGNOSIS — J449 Chronic obstructive pulmonary disease, unspecified: Secondary | ICD-10-CM | POA: Diagnosis not present

## 2021-09-28 DIAGNOSIS — W19XXXA Unspecified fall, initial encounter: Secondary | ICD-10-CM | POA: Diagnosis not present

## 2021-09-28 HISTORY — PX: TOTAL HIP ARTHROPLASTY: SHX124

## 2021-09-28 LAB — SURGICAL PCR SCREEN
MRSA, PCR: NEGATIVE
Staphylococcus aureus: NEGATIVE

## 2021-09-28 LAB — CBC
HCT: 42 % (ref 36.0–46.0)
Hemoglobin: 13.8 g/dL (ref 12.0–15.0)
MCH: 31.2 pg (ref 26.0–34.0)
MCHC: 32.9 g/dL (ref 30.0–36.0)
MCV: 95 fL (ref 80.0–100.0)
Platelets: 222 10*3/uL (ref 150–400)
RBC: 4.42 MIL/uL (ref 3.87–5.11)
RDW: 12.4 % (ref 11.5–15.5)
WBC: 7 10*3/uL (ref 4.0–10.5)
nRBC: 0 % (ref 0.0–0.2)

## 2021-09-28 LAB — ABO/RH: ABO/RH(D): O POS

## 2021-09-28 LAB — TYPE AND SCREEN
ABO/RH(D): O POS
Antibody Screen: NEGATIVE

## 2021-09-28 SURGERY — ARTHROPLASTY, HIP, TOTAL, ANTERIOR APPROACH
Anesthesia: Monitor Anesthesia Care | Site: Hip | Laterality: Left

## 2021-09-28 MED ORDER — ACETAMINOPHEN 325 MG PO TABS: 325.0000 mg | ORAL_TABLET | Freq: Four times a day (QID) | ORAL | Status: DC | PRN

## 2021-09-28 MED ORDER — LACTATED RINGERS IV BOLUS
500.0000 mL | Freq: Once | INTRAVENOUS | Status: AC
Start: 2021-09-28 — End: 2021-09-28
  Administered 2021-09-28: 500 mL via INTRAVENOUS

## 2021-09-28 MED ORDER — BUPIVACAINE LIPOSOME 1.3 % IJ SUSP
INTRAMUSCULAR | Status: AC
  Filled 2021-09-28: qty 20

## 2021-09-28 MED ORDER — ONDANSETRON HCL 4 MG/2ML IJ SOLN: 4.0000 mg | Freq: Four times a day (QID) | INTRAMUSCULAR | Status: DC | PRN

## 2021-09-28 MED ORDER — ONDANSETRON HCL 4 MG/2ML IJ SOLN
INTRAMUSCULAR | Status: DC | PRN
  Administered 2021-09-28: 4 mg via INTRAVENOUS

## 2021-09-28 MED ORDER — METHOCARBAMOL 500 MG PO TABS: 500.0000 mg | ORAL_TABLET | Freq: Four times a day (QID) | ORAL | Status: DC | PRN

## 2021-09-28 MED ORDER — OXYCODONE HCL 5 MG PO TABS: 5.0000 mg | ORAL_TABLET | ORAL | Status: DC | PRN

## 2021-09-28 MED ORDER — MIDAZOLAM HCL 2 MG/2ML IJ SOLN
INTRAMUSCULAR | Status: DC | PRN
  Administered 2021-09-28: 2 mg via INTRAVENOUS

## 2021-09-28 MED ORDER — ACETAMINOPHEN 500 MG PO TABS
1000.0000 mg | ORAL_TABLET | Freq: Once | ORAL | Status: AC
  Administered 2021-09-28: 1000 mg via ORAL
  Filled 2021-09-28: qty 2

## 2021-09-28 MED ORDER — ONDANSETRON HCL 4 MG PO TABS: 4.0000 mg | ORAL_TABLET | Freq: Four times a day (QID) | ORAL | Status: DC | PRN

## 2021-09-28 MED ORDER — DEXMEDETOMIDINE (PRECEDEX) IN NS 20 MCG/5ML (4 MCG/ML) IV SYRINGE
PREFILLED_SYRINGE | INTRAVENOUS | Status: DC | PRN
  Administered 2021-09-28: 8 ug via INTRAVENOUS
  Administered 2021-09-28: 12 ug via INTRAVENOUS

## 2021-09-28 MED ORDER — ASPIRIN 81 MG PO TBEC
81.0000 mg | DELAYED_RELEASE_TABLET | Freq: Two times a day (BID) | ORAL | 0 refills | Status: DC
Start: 2021-09-28 — End: 2021-12-06

## 2021-09-28 MED ORDER — METHOCARBAMOL 750 MG PO TABS: 750.0000 mg | ORAL_TABLET | Freq: Three times a day (TID) | ORAL | 0 refills | Status: DC | PRN

## 2021-09-28 MED ORDER — MELOXICAM 15 MG PO TABS: 15.0000 mg | ORAL_TABLET | Freq: Every day | ORAL | 0 refills | Status: DC | PRN

## 2021-09-28 MED ORDER — MIDAZOLAM HCL 2 MG/2ML IJ SOLN
INTRAMUSCULAR | Status: AC
  Filled 2021-09-28: qty 2

## 2021-09-28 MED ORDER — 0.9 % SODIUM CHLORIDE (POUR BTL) OPTIME
TOPICAL | Status: DC | PRN
  Administered 2021-09-28: 1000 mL

## 2021-09-28 MED ORDER — BUPIVACAINE IN DEXTROSE 0.75-8.25 % IT SOLN
INTRATHECAL | Status: DC | PRN
  Administered 2021-09-28: 1.6 mL via INTRATHECAL

## 2021-09-28 MED ORDER — BUPIVACAINE LIPOSOME 1.3 % IJ SUSP: 10.0000 mL | Freq: Once | INTRAMUSCULAR | Status: DC

## 2021-09-28 MED ORDER — ACETAMINOPHEN 500 MG PO TABS: 1000.0000 mg | ORAL_TABLET | Freq: Four times a day (QID) | ORAL | Status: DC

## 2021-09-28 MED ORDER — ORAL CARE MOUTH RINSE: 15.0000 mL | Freq: Once | OROMUCOSAL | Status: AC

## 2021-09-28 MED ORDER — OXYCODONE HCL 5 MG PO TABS: 5.0000 mg | ORAL_TABLET | Freq: Four times a day (QID) | ORAL | 0 refills | Status: DC | PRN

## 2021-09-28 MED ORDER — PHENYLEPHRINE HCL-NACL 20-0.9 MG/250ML-% IV SOLN
INTRAVENOUS | Status: DC | PRN
  Administered 2021-09-28: 25 ug/min via INTRAVENOUS

## 2021-09-28 MED ORDER — FENTANYL CITRATE (PF) 100 MCG/2ML IJ SOLN: 25.0000 ug | INTRAMUSCULAR | Status: DC | PRN

## 2021-09-28 MED ORDER — ONDANSETRON 4 MG PO TBDP: 4.0000 mg | ORAL_TABLET | Freq: Two times a day (BID) | ORAL | 0 refills | Status: DC | PRN

## 2021-09-28 MED ORDER — DEXMEDETOMIDINE HCL IN NACL 80 MCG/20ML IV SOLN
INTRAVENOUS | Status: AC
  Filled 2021-09-28: qty 20

## 2021-09-28 MED ORDER — POVIDONE-IODINE 10 % EX SWAB
2.0000 | Freq: Once | CUTANEOUS | Status: AC
  Administered 2021-09-28: 2 via TOPICAL

## 2021-09-28 MED ORDER — PROPOFOL 500 MG/50ML IV EMUL
INTRAVENOUS | Status: DC | PRN
  Administered 2021-09-28: 75 ug/kg/min via INTRAVENOUS

## 2021-09-28 MED ORDER — LACTATED RINGERS IV BOLUS: 250.0000 mL | Freq: Once | INTRAVENOUS | Status: DC

## 2021-09-28 MED ORDER — ACETAMINOPHEN 10 MG/ML IV SOLN: 1000.0000 mg | Freq: Once | INTRAVENOUS | Status: DC | PRN

## 2021-09-28 MED ORDER — TRANEXAMIC ACID-NACL 1000-0.7 MG/100ML-% IV SOLN
1000.0000 mg | INTRAVENOUS | Status: AC
  Administered 2021-09-28: 1000 mg via INTRAVENOUS
  Filled 2021-09-28: qty 100

## 2021-09-28 MED ORDER — CEFAZOLIN SODIUM-DEXTROSE 1-4 GM/50ML-% IV SOLN
1.0000 g | Freq: Four times a day (QID) | INTRAVENOUS | Status: DC
  Filled 2021-09-28 (×2): qty 50

## 2021-09-28 MED ORDER — DOCUSATE SODIUM 100 MG PO CAPS: 100.0000 mg | ORAL_CAPSULE | Freq: Two times a day (BID) | ORAL | 0 refills | Status: AC

## 2021-09-28 MED ORDER — HYDROMORPHONE HCL 1 MG/ML IJ SOLN: 0.5000 mg | INTRAMUSCULAR | Status: DC | PRN

## 2021-09-28 MED ORDER — OXYCODONE HCL 5 MG PO TABS: 10.0000 mg | ORAL_TABLET | ORAL | Status: DC | PRN

## 2021-09-28 MED ORDER — METHOCARBAMOL 1000 MG/10ML IJ SOLN: 500.0000 mg | Freq: Four times a day (QID) | INTRAVENOUS | Status: DC | PRN

## 2021-09-28 MED ORDER — CEFAZOLIN SODIUM-DEXTROSE 2-4 GM/100ML-% IV SOLN
2.0000 g | INTRAVENOUS | Status: AC
  Administered 2021-09-28: 2 g via INTRAVENOUS
  Filled 2021-09-28: qty 100

## 2021-09-28 MED ORDER — TRAMADOL HCL 50 MG PO TABS: 50.0000 mg | ORAL_TABLET | Freq: Four times a day (QID) | ORAL | Status: DC

## 2021-09-28 MED ORDER — LACTATED RINGERS IV SOLN: INTRAVENOUS | Status: DC

## 2021-09-28 MED ORDER — METOCLOPRAMIDE HCL 5 MG PO TABS: 5.0000 mg | ORAL_TABLET | Freq: Three times a day (TID) | ORAL | Status: DC | PRN

## 2021-09-28 MED ORDER — BUPIVACAINE LIPOSOME 1.3 % IJ SUSP
INTRAMUSCULAR | Status: DC | PRN
  Administered 2021-09-28: 20 mL

## 2021-09-28 MED ORDER — PROPOFOL 10 MG/ML IV BOLUS
INTRAVENOUS | Status: DC | PRN
  Administered 2021-09-28: 20 mg via INTRAVENOUS

## 2021-09-28 MED ORDER — DEXAMETHASONE SODIUM PHOSPHATE 10 MG/ML IJ SOLN
8.0000 mg | Freq: Once | INTRAMUSCULAR | Status: AC
  Administered 2021-09-28: 8 mg via INTRAVENOUS
  Filled 2021-09-28: qty 1

## 2021-09-28 MED ORDER — TRANEXAMIC ACID-NACL 1000-0.7 MG/100ML-% IV SOLN
1000.0000 mg | Freq: Once | INTRAVENOUS | Status: DC
  Filled 2021-09-28: qty 100

## 2021-09-28 MED ORDER — SODIUM CHLORIDE FLUSH 0.9 % IV SOLN
INTRAVENOUS | Status: DC | PRN
  Administered 2021-09-28: 30 mL via INTRAVENOUS

## 2021-09-28 MED ORDER — FENTANYL CITRATE (PF) 250 MCG/5ML IJ SOLN
INTRAMUSCULAR | Status: DC | PRN
Start: 2021-09-28 — End: 2021-09-28
  Administered 2021-09-28: 50 ug via INTRAVENOUS
  Administered 2021-09-28: 25 ug via INTRAVENOUS

## 2021-09-28 MED ORDER — CHLORHEXIDINE GLUCONATE 0.12 % MT SOLN
15.0000 mL | Freq: Once | OROMUCOSAL | Status: AC
  Administered 2021-09-28: 15 mL via OROMUCOSAL
  Filled 2021-09-28: qty 15

## 2021-09-28 MED ORDER — POVIDONE-IODINE 10 % EX SWAB: 2.0000 | Freq: Once | CUTANEOUS | Status: DC

## 2021-09-28 MED ORDER — FENTANYL CITRATE (PF) 250 MCG/5ML IJ SOLN
INTRAMUSCULAR | Status: AC
  Filled 2021-09-28: qty 5

## 2021-09-28 MED ORDER — METOCLOPRAMIDE HCL 5 MG/ML IJ SOLN: 5.0000 mg | Freq: Three times a day (TID) | INTRAMUSCULAR | Status: DC | PRN

## 2021-09-28 SURGICAL SUPPLY — 57 items
BAG COUNTER SPONGE SURGICOUNT (BAG) ×1 IMPLANT
BAG SPNG CNTER NS LX DISP (BAG) ×1
CLSR STERI-STRIP ANTIMIC 1/2X4 (GAUZE/BANDAGES/DRESSINGS) ×1 IMPLANT
COVER PERINEAL POST (MISCELLANEOUS) ×1 IMPLANT
COVER SURGICAL LIGHT HANDLE (MISCELLANEOUS) ×1 IMPLANT
DRAPE C-ARM 42X72 X-RAY (DRAPES) ×1 IMPLANT
DRAPE IMP U-DRAPE 54X76 (DRAPES) IMPLANT
DRAPE STERI IOBAN 125X83 (DRAPES) ×1 IMPLANT
DRAPE U-SHAPE 47X51 STRL (DRAPES) IMPLANT
DRSG MEPILEX BORDER 4X8 (GAUZE/BANDAGES/DRESSINGS) ×1 IMPLANT
DRSG MEPILEX POST OP 4X12 (GAUZE/BANDAGES/DRESSINGS) IMPLANT
DURAPREP 26ML APPLICATOR (WOUND CARE) ×1 IMPLANT
ELECT BLADE 4.0 EZ CLEAN MEGAD (MISCELLANEOUS) ×1
ELECT REM PT RETURN 9FT ADLT (ELECTROSURGICAL) ×1
ELECTRODE BLDE 4.0 EZ CLN MEGD (MISCELLANEOUS) ×1 IMPLANT
ELECTRODE REM PT RTRN 9FT ADLT (ELECTROSURGICAL) ×1 IMPLANT
FACESHIELD WRAPAROUND (MASK) ×1 IMPLANT
FACESHIELD WRAPAROUND OR TEAM (MASK) ×1 IMPLANT
GLOVE BIO SURGEON STRL SZ7.5 (GLOVE) ×1 IMPLANT
GLOVE BIOGEL PI IND STRL 7.5 (GLOVE) ×1 IMPLANT
GLOVE BIOGEL PI IND STRL 8 (GLOVE) ×1 IMPLANT
GLOVE BIOGEL PI INDICATOR 7.5 (GLOVE) ×1
GLOVE BIOGEL PI INDICATOR 8 (GLOVE) ×1
GLOVE SURG SYN 7.5  E (GLOVE) ×1
GLOVE SURG SYN 7.5 E (GLOVE) ×1 IMPLANT
GLOVE SURG SYN 7.5 PF PI (GLOVE) ×1 IMPLANT
GOWN STRL REUS W/ TWL LRG LVL3 (GOWN DISPOSABLE) ×2 IMPLANT
GOWN STRL REUS W/ TWL XL LVL3 (GOWN DISPOSABLE) ×1 IMPLANT
GOWN STRL REUS W/TWL LRG LVL3 (GOWN DISPOSABLE) ×2
GOWN STRL REUS W/TWL XL LVL3 (GOWN DISPOSABLE) ×1
HEAD CERAMIC FEMORAL 36MM (Head) IMPLANT
INSERT 0 DEGREE 36 (Miscellaneous) IMPLANT
KIT BASIN OR (CUSTOM PROCEDURE TRAY) ×1 IMPLANT
KIT TURNOVER KIT B (KITS) ×1 IMPLANT
MANIFOLD NEPTUNE II (INSTRUMENTS) ×1 IMPLANT
NEEDLE HYPO 22GX1.5 SAFETY (NEEDLE) ×1 IMPLANT
NS IRRIG 1000ML POUR BTL (IV SOLUTION) ×1 IMPLANT
PACK TOTAL JOINT (CUSTOM PROCEDURE TRAY) ×1 IMPLANT
PAD ARMBOARD 7.5X6 YLW CONV (MISCELLANEOUS) ×1 IMPLANT
SCREW HEX LP 6.5X20 (Screw) IMPLANT
SHELL ACETAB TRIDENT 48 (Shell) IMPLANT
STEM HIP 127 DEG (Stem) IMPLANT
SUT MNCRL AB 3-0 PS2 18 (SUTURE) IMPLANT
SUT MNCRL AB 4-0 PS2 18 (SUTURE) ×1 IMPLANT
SUT STRATAFIX 1PDS 45CM VIOLET (SUTURE) ×1 IMPLANT
SUT VIC AB 0 CT1 27 (SUTURE) ×2
SUT VIC AB 0 CT1 27XBRD ANBCTR (SUTURE) ×2 IMPLANT
SUT VIC AB 1 CT1 27 (SUTURE) ×2
SUT VIC AB 1 CT1 27XBRD ANBCTR (SUTURE) ×2 IMPLANT
SUT VIC AB 2-0 CT1 27 (SUTURE) ×2
SUT VIC AB 2-0 CT1 TAPERPNT 27 (SUTURE) ×2 IMPLANT
SYR 50ML LL SCALE MARK (SYRINGE) ×2 IMPLANT
SYR BULB IRRIG 60ML STRL (SYRINGE) ×1 IMPLANT
TOWEL GREEN STERILE (TOWEL DISPOSABLE) ×1 IMPLANT
TOWEL GREEN STERILE FF (TOWEL DISPOSABLE) ×1 IMPLANT
WATER STERILE IRR 1000ML POUR (IV SOLUTION) ×1 IMPLANT
YANKAUER SUCT BULB TIP NO VENT (SUCTIONS) ×2 IMPLANT

## 2021-09-28 NOTE — Anesthesia Procedure Notes (Signed)
Procedure Name: MAC Date/Time: 09/28/2021 12:06 PM  Performed by: Carolan Clines, CRNAPre-anesthesia Checklist: Patient identified, Emergency Drugs available, Suction available and Patient being monitored Patient Re-evaluated:Patient Re-evaluated prior to induction Oxygen Delivery Method: Simple face mask Dental Injury: Teeth and Oropharynx as per pre-operative assessment

## 2021-09-28 NOTE — Evaluation (Signed)
Physical Therapy Evaluation Patient Details Name: Linda Davies MRN: [id number] DOB: 07-21-1950 Today's Date: 09/28/2021  History of Present Illness  Pt is 71 yo female s/p L anterior THA on 09/28/21 after hip fracture a few days ago.  Pt with hx of arthritis, COPD, R hip fx 10/22 with pinning then R THA revision 4/23.  Clinical Impression  Pt is s/p THA resulting in the deficits listed below (see PT Problem List). At baseline pt is very active and independent . Takes care of 3 young grandchildren frequently and likes to do yoga. PT asked to see pt for same day d/c in PACU.  At arrival, pt was dressed , in transport chair, IV out and ready to go.  She was able to ambulate with PT 150' but with min guard and mild unsteadiness.  Pt did have some impulsiveness requiring cues for safety. Spouse was not allowed in PACU, so went with nurse as pt was discharging to speak with spouse and educate on guarding with gait and safety, Recommended close guarding with mobility until improves - pt reports outpt PT scheduled Thursday.Pt will benefit from skilled PT to increase their independence and safety with mobility to allow discharge to the venue listed below.         Recommendations for follow up therapy are one component of a multi-disciplinary discharge planning process, led by the attending physician.  Recommendations may be updated based on patient status, additional functional criteria and insurance authorization.  Follow Up Recommendations Follow physician's recommendations for discharge plan and follow up therapies      Assistance Recommended at Discharge Frequent or constant Supervision/Assistance  Patient can return home with the following  A little help with walking and/or transfers;A little help with bathing/dressing/bathroom;Assistance with cooking/housework;Direct supervision/assist for medications management    Equipment Recommendations None recommended by PT  Recommendations for Other  Services       Functional Status Assessment Patient has had a recent decline in their functional status and demonstrates the ability to make significant improvements in function in a reasonable and predictable amount of time.     Precautions / Restrictions Precautions Precautions: Fall Restrictions Weight Bearing Restrictions: Yes LLE Weight Bearing: Weight bearing as tolerated      Mobility  Bed Mobility Overal bed mobility: Needs Assistance Bed Mobility: Supine to Sit     Supine to sit: Supervision     General bed mobility comments: Pt was in chair at arrival but RN reports pt transferred to the EOB easily and quickly  At arrival, pt already in transport chair, dressed, IV out, paperwork complete, ready for same day d/c  Transfers Overall transfer level: Needs assistance Equipment used: Rolling walker (2 wheels) Transfers: Sit to/from Stand Sit to Stand: Min guard           General transfer comment: Min guard for safety; good hand placement; Also did car transfer with min guard - pt requiring cues to keep RW and back to seat    Ambulation/Gait Ambulation/Gait assistance: Min guard Gait Distance (Feet): 150 Feet (150' then 30') Assistive device: Rolling walker (2 wheels) Gait Pattern/deviations: Step-through pattern, Decreased stride length       General Gait Details: Pt ambulated in PACU 150' with episodes of feet hitting ground heavy and mild unsteadiness.  She later reported that sensation was less in her feet but could feel them. Walked with pt out to the car so that could ambulate with spouse since he was not allowed in PACU.  Had pt  ambulate 78' with RW, reports feet feeling better (less heavy feet this time) and spouse assist.  Spouse comfortable using gait belt to assist with walking.  Stairs            Wheelchair Mobility    Modified Rankin (Stroke Patients Only)       Balance Overall balance assessment: Needs assistance Sitting-balance  support: No upper extremity supported Sitting balance-Leahy Scale: Good     Standing balance support: No upper extremity supported, Bilateral upper extremity supported Standing balance-Leahy Scale: Fair Standing balance comment: RW to ambulate but could static stand no AD                             Pertinent Vitals/Pain Pain Assessment Pain Assessment: No/denies pain    Home Living Family/patient expects to be discharged to:: Private residence Living Arrangements: Spouse/significant other Available Help at Discharge: Family;Available 24 hours/day Type of Home: House Home Access: Level entry       Home Layout: Two level;Able to live on main level with bedroom/bathroom Home Equipment: Rolling Walker (2 wheels);Shower seat;Cane - single point      Prior Function Prior Level of Function : Independent/Modified Independent;Driving             Mobility Comments: Could ambulate in community without AD ADLs Comments: independent with ADLs and IADLs     Hand Dominance        Extremity/Trunk Assessment   Upper Extremity Assessment Upper Extremity Assessment: Overall WFL for tasks assessed    Lower Extremity Assessment Lower Extremity Assessment: LLE deficits/detail;RLE deficits/detail RLE Deficits / Details: ROM WFL; MMT 5/5 RLE Sensation: decreased light touch LLE Deficits / Details: ROM WFL; MMT at least 3/5 throughotu LLE Sensation: decreased light touch (slightly decreased in feet)    Cervical / Trunk Assessment Cervical / Trunk Assessment: Normal  Communication   Communication: No difficulties  Cognition Arousal/Alertness: Awake/alert Behavior During Therapy: Impulsive Overall Cognitive Status: Impaired/Different from baseline Area of Impairment: Safety/judgement                         Safety/Judgement: Decreased awareness of safety     General Comments: Pt seen shortly after surgery.  She is a bit impulsive and needs cues for  safety.  Reports that she is always going fast.  Educated pt and spouse on safety        General Comments  Educated on safe ice use, no pivots, car transfers. Also, encouraged walking every 1-2 hours during day. Educated on HEP with focus on mobility the first weeks. Discussed doing exercises within pain control and if pain increasing could decreased ROM, reps, and stop exercises as needed. Encouraged to perform quad sets and ankle pumps frequently for blood flow. Pt reports very familiar as she has had 3 hip sx in 10 months.   Spent increased time educating pt and spouse on safety.  Pt somewhat impulsive and shaky, likely medication related and expected to progress well.  Recommended use of gait belt and hands on assist tonight and progressing to supervision as pt improves.  Spouse verbalized understanding.    Exercises General Exercises - Lower Extremity Ankle Circles/Pumps: AROM, Both, 5 reps, Seated Long Arc Quad: AROM, Both, 5 reps, Seated Heel Slides:  (Pt already in chair, no bed. Educated on heel slide and abd - pt familiar from prior sx)   Assessment/Plan    PT Assessment Patient needs  continued PT services (pt in car discharging)  PT Problem List Decreased strength;Decreased mobility;Decreased range of motion;Decreased activity tolerance;Decreased safety awareness;Decreased balance;Decreased knowledge of use of DME;Pain       PT Treatment Interventions DME instruction;Therapeutic activities;Modalities;Gait training;Therapeutic exercise;Patient/family education;Stair training;Balance training;Functional mobility training    PT Goals (Current goals can be found in the Care Plan section)  Acute Rehab PT Goals Patient Stated Goal: return home today; eat PT Goal Formulation: With patient/family Time For Goal Achievement: 10/12/21 Potential to Achieve Goals: Good    Frequency 7X/week     Co-evaluation               AM-PAC PT "6 Clicks" Mobility  Outcome Measure Help  needed turning from your back to your side while in a flat bed without using bedrails?: None Help needed moving from lying on your back to sitting on the side of a flat bed without using bedrails?: A Little Help needed moving to and from a bed to a chair (including a wheelchair)?: A Little Help needed standing up from a chair using your arms (e.g., wheelchair or bedside chair)?: A Little Help needed to walk in hospital room?: A Little Help needed climbing 3-5 steps with a railing? : A Little 6 Click Score: 19    End of Session Equipment Utilized During Treatment: Gait belt Activity Tolerance: Patient tolerated treatment well Patient left: Other (comment) (in car w spouse for d/c) Nurse Communication: Mobility status PT Visit Diagnosis: Other abnormalities of gait and mobility (R26.89)    Time: 5361-4431 PT Time Calculation (min) (ACUTE ONLY): 34 min   Charges:   PT Evaluation $PT Eval Low Complexity: 1 Low PT Treatments $Gait Training: 8-22 mins        Abran Richard, PT Acute Rehab [facility] 09/28/2021, 5:32 PM

## 2021-09-28 NOTE — Transfer of Care (Signed)
Immediate Anesthesia Transfer of Care Note  Patient: Linda Davies  Procedure(s) Performed: TOTAL HIP ARTHROPLASTY ANTERIOR APPROACH (Left: Hip)  Patient Location: PACU  Anesthesia Type:MAC and Spinal  Level of Consciousness: awake, alert  and oriented  Airway & Oxygen Therapy: Patient Spontanous Breathing  Post-op Assessment: Report given to RN and Post -op Vital signs reviewed and stable  Post vital signs: Reviewed and stable  Last Vitals:  Vitals Value Taken Time  BP 118/70 09/28/21 1350  Temp    Pulse 62 09/28/21 1356  Resp 18 09/28/21 1356  SpO2 93 % 09/28/21 1356  Vitals shown include unvalidated device data.  Last Pain:  Vitals:   09/28/21 0950  TempSrc:   PainSc: 0-No pain         Complications: No notable events documented.

## 2021-09-28 NOTE — Interval H&P Note (Signed)
History and Physical Interval Note:  09/28/2021 9:33 AM  Linda Davies  has presented today for surgery, with the diagnosis of LEFT HIP FRACTURE.  The various methods of treatment have been discussed with the patient and family. After consideration of risks, benefits and other options for treatment, the patient has consented to  Procedure(s): TOTAL HIP ARTHROPLASTY ANTERIOR APPROACH (Left) as a surgical intervention.  The patient's history has been reviewed, patient examined, no change in status, stable for surgery.  I have reviewed the patient's chart and labs.  Questions were answered to the patient's satisfaction.     [name]

## 2021-09-28 NOTE — Anesthesia Procedure Notes (Signed)
Spinal  Patient location during procedure: OR Start time: 09/28/2021 12:03 PM End time: 09/28/2021 12:05 PM Staffing Performed: anesthesiologist  Anesthesiologist: Darral Dash, DO Performed by: Darral Dash, DO Authorized by: Darral Dash, DO   Preanesthetic Checklist Completed: patient identified, IV checked, site marked, risks and benefits discussed, surgical consent, monitors and equipment checked, pre-op evaluation and timeout performed Spinal Block Patient position: sitting Prep: DuraPrep Patient monitoring: heart rate, cardiac monitor, continuous pulse ox and blood pressure Approach: midline Location: L4-5 Injection technique: single-shot Needle Needle type: Pencan  Needle gauge: 24 G Needle length: 10 cm Assessment Events: CSF return Additional Notes Patient identified. Risks/Benefits/Options discussed with patient including but not limited to bleeding, infection, nerve damage, paralysis, failed block, incomplete pain control, headache, blood pressure changes, nausea, vomiting, reactions to medications, itching and postpartum back pain. Confirmed with bedside nurse the patient's most recent platelet count. Confirmed with patient that they are not currently taking any anticoagulation, have any bleeding history or any family history of bleeding disorders. Patient expressed understanding and wished to proceed. All questions were answered. Sterile technique was used throughout the entire procedure. Please see nursing notes for vital signs. Warning signs of high block given to the patient including shortness of breath, tingling/numbness in hands, complete motor block, or any concerning symptoms with instructions to call for help. Patient was given instructions on fall risk and not to get out of bed. All questions and concerns addressed with instructions to call with any issues or inadequate analgesia.

## 2021-09-28 NOTE — Discharge Instructions (Addendum)

## 2021-09-28 NOTE — Op Note (Signed)
09/28/2021  1:08 PM  PATIENT:  Linda Davies   MRN: [id number]  PRE-OPERATIVE DIAGNOSIS:  LEFT HIP FRACTURE  POST-OPERATIVE DIAGNOSIS:  LEFT HIP FRACTURE  PROCEDURE:  Procedure(s): TOTAL HIP ARTHROPLASTY ANTERIOR APPROACH  PREOPERATIVE INDICATIONS:    Linda Davies is an 71 y.o. female who has a diagnosis of <principal problem not specified> and elected for surgical management after failing conservative treatment.  The risks benefits and alternatives were discussed with the patient including but not limited to the risks of nonoperative treatment, versus surgical intervention including infection, bleeding, nerve injury, periprosthetic fracture, the need for revision surgery, dislocation, leg length discrepancy, blood clots, cardiopulmonary complications, morbidity, mortality, among others, and they were willing to proceed.     OPERATIVE REPORT     SURGEON:   [name], MD    ASSISTANT:  Aggie Moats, PA-C, he was present and scrubbed throughout the case, critical for completion in a timely fashion, and for retraction, instrumentation, and closure.     ANESTHESIA:  General    COMPLICATIONS:  None.     COMPONENTS:  [employer]ker acolade fit femur size 5 with a 36 mm -0 head ball and an acetabular shell size 48 with a  polyethylene liner    PROCEDURE IN DETAIL:   The patient was met in the holding area and  identified.  The appropriate hip was identified and marked at the operative site.  The patient was then transported to the OR  and  placed under anesthesia per that record.  At that point, the patient was  placed in the supine position and  secured to the operating room table and all bony prominences padded. He received pre-operative antibiotics    The operative lower extremity was prepped from the iliac crest to the distal leg.  Sterile draping was performed.  Time out was performed prior to incision.      Skin incision was made just 2 cm lateral to the ASIS  extending in line with  the tensor fascia lata. Electrocautery was used to control all bleeders. I dissected down sharply to the fascia of the tensor fascia lata was confirmed that the muscle fibers beneath were running posteriorly. I then incised the fascia over the superficial tensor fascia lata in line with the incision. The fascia was elevated off the anterior aspect of the muscle the muscle was retracted posteriorly and protected throughout the case. I then used electrocautery to incise the tensor fascia lata fascia control and all bleeders. Immediately visible was the fat over top of the anterior neck and capsule.  I removed the anterior fat from the capsule and elevated the rectus muscle off of the anterior capsule. I then removed a large time of capsule. The retractors were then placed over the anterior acetabulum as well as around the superior and inferior neck.  I then made a femoral neck cut. Then used the power corkscrew to remove the femoral head from the acetabulum and thoroughly irrigated the acetabulum. I sized the femoral head.    I then exposed the deep acetabulum, cleared out any tissue including the ligamentum teres.   After adequate visualization, I excised the labrum, and then sequentially reamed.  I then impacted the acetabular implant into place using fluoroscopy for guidance.  Appropriate version and inclination was confirmed clinically matching their bony anatomy, and with fluoroscopy.  I placed a 20 mm screw in the posterior/superio position with an excellent bite.    I then placed the polyethylene liner in place  I then adducted the leg and released the external rotators from the posterior femur allowing it to be easily delivered up lateral and anterior to the acetabulum for preparation of the femoral canal.    I then prepared the proximal femur using the cookie-cutter and then sequentially reamed and broached.  A trial broach, neck, and head was utilized, and I reduced the hip and used  floroscopy to assess the neck length and femoral implant.  I then impacted the femoral prosthesis into place into the appropriate version. The hip was then reduced and fluoroscopy confirmed appropriate position. Leg lengths were restored.  I then irrigated the hip copiously again with, and repaired the fascia with Vicryl, followed by monocryl for the subcutaneous tissue, Monocryl for the skin, Steri-Strips and sterile gauze. The patient was then awakened and returned to PACU in stable and satisfactory condition. There were no complications.  POST OPERATIVE PLAN: WBAT, DVT px: SCD's/TED, ambulation and chemical dvt px  Karyna Bessler, MD Orthopedic Surgeon [phone]     

## 2021-09-28 NOTE — Anesthesia Preprocedure Evaluation (Signed)
Anesthesia Evaluation  Patient identified by MRN, date of birth, ID band Patient awake    Reviewed: Allergy & Precautions, NPO status , Patient's Chart, lab work & pertinent test results  Airway Mallampati: II  TM Distance: >3 FB Neck ROM: Full    Dental no notable dental hx.    Pulmonary COPD, Current Smoker and Patient abstained from smoking.,    Pulmonary exam normal        Cardiovascular negative cardio ROS   Rhythm:Regular Rate:Normal     Neuro/Psych Anxiety negative neurological ROS     GI/Hepatic Neg liver ROS, GERD  Medicated,  Endo/Other  negative endocrine ROS  Renal/GU negative Renal ROS  negative genitourinary   Musculoskeletal  (+) Arthritis , Osteoarthritis,  Hip fracture   Abdominal Normal abdominal exam  (+)   Peds  Hematology  (+) Blood dyscrasia, anemia ,   Anesthesia Other Findings   Reproductive/Obstetrics                            Anesthesia Physical Anesthesia Plan  ASA: 2  Anesthesia Plan: MAC and Spinal   Post-op Pain Management:    Induction: Intravenous  PONV Risk Score and Plan: 1 and Ondansetron, Dexamethasone, Propofol infusion and Treatment may vary due to age or medical condition  Airway Management Planned: Simple Face Mask, Natural Airway and Nasal Cannula  Additional Equipment: None  Intra-op Plan:   Post-operative Plan:   Informed Consent: I have reviewed the patients History and Physical, chart, labs and discussed the procedure including the risks, benefits and alternatives for the proposed anesthesia with the patient or authorized representative who has indicated his/her understanding and acceptance.     Dental advisory given  Plan Discussed with: CRNA  Anesthesia Plan Comments:        Anesthesia Quick Evaluation

## 2021-09-29 NOTE — Anesthesia Postprocedure Evaluation (Signed)
Anesthesia Post Note  Patient: Linda Davies  Procedure(s) Performed: TOTAL HIP ARTHROPLASTY ANTERIOR APPROACH (Left: Hip)     Patient location during evaluation: PACU Anesthesia Type: MAC and Spinal Level of consciousness: awake and alert Pain management: pain level controlled Vital Signs Assessment: post-procedure vital signs reviewed and stable Respiratory status: spontaneous breathing, nonlabored ventilation, respiratory function stable and patient connected to nasal cannula oxygen Cardiovascular status: stable and blood pressure returned to baseline Postop Assessment: no apparent nausea or vomiting Anesthetic complications: no   No notable events documented.  Last Vitals:  Vitals:   09/28/21 1545 09/28/21 1600  BP: (!) 146/65 (!) 147/82  Pulse: 62 (!) 59  Resp: 19 19  Temp:  36.5 C  SpO2: 93% 96%    Last Pain:  Vitals:   09/28/21 1545  TempSrc:   PainSc: 0-No pain                 Belenda Cruise P Juandaniel Manfredo

## 2021-09-30 DIAGNOSIS — M1612 Unilateral primary osteoarthritis, left hip: Secondary | ICD-10-CM | POA: Diagnosis not present

## 2021-09-30 DIAGNOSIS — Z96642 Presence of left artificial hip joint: Secondary | ICD-10-CM | POA: Diagnosis not present

## 2021-10-01 ENCOUNTER — Encounter (HOSPITAL_COMMUNITY): Payer: Self-pay | Admitting: Orthopedic Surgery

## 2021-10-07 DIAGNOSIS — Z96642 Presence of left artificial hip joint: Secondary | ICD-10-CM | POA: Diagnosis not present

## 2021-10-07 DIAGNOSIS — M1612 Unilateral primary osteoarthritis, left hip: Secondary | ICD-10-CM | POA: Diagnosis not present

## 2021-10-12 DIAGNOSIS — M1612 Unilateral primary osteoarthritis, left hip: Secondary | ICD-10-CM | POA: Diagnosis not present

## 2021-10-12 DIAGNOSIS — Z96642 Presence of left artificial hip joint: Secondary | ICD-10-CM | POA: Diagnosis not present

## 2021-10-13 DIAGNOSIS — M1612 Unilateral primary osteoarthritis, left hip: Secondary | ICD-10-CM | POA: Diagnosis not present

## 2021-10-19 DIAGNOSIS — Z96642 Presence of left artificial hip joint: Secondary | ICD-10-CM | POA: Diagnosis not present

## 2021-10-19 DIAGNOSIS — M1612 Unilateral primary osteoarthritis, left hip: Secondary | ICD-10-CM | POA: Diagnosis not present

## 2021-11-02 DIAGNOSIS — M1612 Unilateral primary osteoarthritis, left hip: Secondary | ICD-10-CM | POA: Diagnosis not present

## 2021-11-02 DIAGNOSIS — Z96642 Presence of left artificial hip joint: Secondary | ICD-10-CM | POA: Diagnosis not present

## 2021-11-15 ENCOUNTER — Other Ambulatory Visit: Payer: Self-pay

## 2021-11-15 MED ORDER — ALPRAZOLAM 0.5 MG PO TABS
ORAL_TABLET | ORAL | 0 refills | Status: DC
Start: 2021-11-15 — End: 2021-11-30

## 2021-11-15 NOTE — Telephone Encounter (Signed)
#  20 alprazolam rx'd. Pt needs f/u.

## 2021-11-15 NOTE — Telephone Encounter (Signed)
Requesting: alprazolam Contract: 04/01/20 UDS: 11/03/20 Last Visit: 04/09/21 CPE Next Visit: n/a Last Refill: 04/21/21 (60,5)  Please Advise. Medication pending

## 2021-11-16 NOTE — Telephone Encounter (Signed)
LM for pt regarding medication ?

## 2021-11-17 DIAGNOSIS — R5383 Other fatigue: Secondary | ICD-10-CM | POA: Diagnosis not present

## 2021-11-17 DIAGNOSIS — E559 Vitamin D deficiency, unspecified: Secondary | ICD-10-CM | POA: Diagnosis not present

## 2021-11-17 DIAGNOSIS — M810 Age-related osteoporosis without current pathological fracture: Secondary | ICD-10-CM | POA: Diagnosis not present

## 2021-11-19 NOTE — Telephone Encounter (Signed)
LM for pt regarding medication. If pt returns call, please inform.

## 2021-11-22 DIAGNOSIS — M85832 Other specified disorders of bone density and structure, left forearm: Secondary | ICD-10-CM | POA: Diagnosis not present

## 2021-11-22 DIAGNOSIS — M85831 Other specified disorders of bone density and structure, right forearm: Secondary | ICD-10-CM | POA: Diagnosis not present

## 2021-11-22 DIAGNOSIS — Z780 Asymptomatic menopausal state: Secondary | ICD-10-CM | POA: Diagnosis not present

## 2021-11-30 ENCOUNTER — Telehealth: Payer: Self-pay | Admitting: Family Medicine

## 2021-11-30 MED ORDER — ALPRAZOLAM 0.5 MG PO TABS: ORAL_TABLET | ORAL | 0 refills | Status: DC

## 2021-11-30 NOTE — Telephone Encounter (Signed)
Okay, #30 alprazolam sent in.

## 2021-11-30 NOTE — Telephone Encounter (Signed)
Pt is asking for a refill on her Xanax until her scheduled appt on Nov 6.

## 2021-11-30 NOTE — Telephone Encounter (Signed)
LM for pt to return call to discuss.  

## 2021-12-06 ENCOUNTER — Encounter: Payer: Self-pay | Admitting: Family Medicine

## 2021-12-06 ENCOUNTER — Ambulatory Visit: Payer: Medicare PPO | Admitting: Family Medicine

## 2021-12-06 VITALS — BP 161/77 | HR 72 | Temp 98.2°F | Ht 65.0 in | Wt 129.8 lb

## 2021-12-06 DIAGNOSIS — Z79899 Other long term (current) drug therapy: Secondary | ICD-10-CM | POA: Diagnosis not present

## 2021-12-06 DIAGNOSIS — F411 Generalized anxiety disorder: Secondary | ICD-10-CM | POA: Diagnosis not present

## 2021-12-06 DIAGNOSIS — R030 Elevated blood-pressure reading, without diagnosis of hypertension: Secondary | ICD-10-CM

## 2021-12-06 DIAGNOSIS — K219 Gastro-esophageal reflux disease without esophagitis: Secondary | ICD-10-CM

## 2021-12-06 DIAGNOSIS — Z23 Encounter for immunization: Secondary | ICD-10-CM

## 2021-12-06 DIAGNOSIS — Z8719 Personal history of other diseases of the digestive system: Secondary | ICD-10-CM | POA: Diagnosis not present

## 2021-12-06 MED ORDER — ALPRAZOLAM 0.5 MG PO TABS
ORAL_TABLET | ORAL | 1 refills | Status: DC
Start: 2021-12-06 — End: 2022-06-20

## 2021-12-06 MED ORDER — PANTOPRAZOLE SODIUM 40 MG PO TBEC: 40.0000 mg | DELAYED_RELEASE_TABLET | Freq: Every day | ORAL | 1 refills | Status: DC

## 2021-12-06 NOTE — Progress Notes (Signed)
OFFICE VISIT  12/06/2021  CC:  Chief Complaint  Patient presents with   Anxiety    Follow up   Patient is a 71 y.o. female who presents for follow-up anxiety. She has done well long-term on Xanax.  INTERIM HX: Lots of stress lately but she is hanging in there, typically taking 1 alprazolam around bedtime, some days half to 1 tab in the day as well. Since I saw her last she has had both hips replaced.  She is doing well with this, has been through rehab, rarely takes Tylenol.  Her orthopedist arranged a bone density scan and she got this recently at [facility] and goes back to see him next week to review results.  She does take pantoprazole 40 mg a day for history of dyspepsia/GERD. She does not check her blood pressure at home.  PMP AWARE reviewed today: most recent rx for alprazolam was filled 11/30/2021, #30, rx by me. No red flags.   Past Medical History:  Diagnosis Date   Allergic rhinitis    Anxiety    Arthritis    low back and both hands   Basal cell carcinoma    forehead - removed in office   COPD (chronic obstructive pulmonary disease) ([place])    asymptomatic BUT has purse-lipped breathing on exam even when feeling well   COVID 2021   on prednisone  and antibiotics   Gastritis 08/17/2017   EGD   GERD (gastroesophageal reflux disease)    Iron deficiency anemia 05/2017     Hemoccults neg 05/26/17.  Colonoscopy -->no culprit.  EGD--> gastritis (h pyl NEG).   Melanoma ([place])    Near umbilicus. close f/u with Dr. Delman Cheadle (annual)   Pneumonia    x 1   S/P right hip fracture 10/2020   Tobacco dependence     Past Surgical History:  Procedure Laterality Date   CESAREAN SECTION  1981   spinal   COLONOSCOPY  08/17/2017   Done for unexplained IDA-->Diverticulosis, non-bleeding internal hem, o/w normal.  Repeat 10 yrs.   ESOPHAGOGASTRODUODENOSCOPY  08/17/2017   +Gastritis.  Gastric bx "reactive gastritis".  Bx for H pylori NEG.        Duodenum bx: NORMAL.   EYE SURGERY Left     lasik   HIP PINNING,CANNULATED Right 11/26/2020   Procedure: PERCUTANEOUS PINNING EXTREMITYproximal femur;  Surgeon: Marchia Bond, MD;  Location: [place];  Service: Orthopedics;  Laterality: Right;   MELANOMA EXCISION     abd midline below belly button   TOTAL HIP ARTHROPLASTY Right 05/04/2021   Procedure: TOTAL ANTERIOR APPROACH HIP ARTHROPLASTY WITH SCREW REMOVAL;  Surgeon: [name], MD;  Location: [facility];  Service: Orthopedics;  Laterality: Right;   TOTAL HIP ARTHROPLASTY Left 09/28/2021   Procedure: TOTAL HIP ARTHROPLASTY ANTERIOR APPROACH;  Surgeon: [name], MD;  Location: [place];  Service: Orthopedics;  Laterality: Left;    Outpatient Medications Prior to Visit  Medication Sig Dispense Refill   acetaminophen (TYLENOL) 500 MG tablet Take 2 tablets (1,000 mg total) by mouth every 6 (six) hours as needed for moderate pain or mild pain. 60 tablet 0   ALPRAZolam (XANAX) 0.5 MG tablet TAKE 1/2 TO 1 TABLET BY MOUTH TWICE DAILY AS NEEDED FOR ANXIETY 30 tablet 0   Biotin w/ Vitamins C & E (HAIR/SKIN/NAILS PO) Take 1 tablet by mouth daily.     Calcium-Vitamin D 500-3.125 MG-MCG TABS Take 1 tablet by mouth daily.     cetirizine (ZYRTEC) 10 MG tablet  Take 10 mg by mouth in the morning.     cholecalciferol (VITAMIN D) 1000 units tablet Take 1,000 Units by mouth daily.     pantoprazole (PROTONIX) 40 MG tablet TAKE 1 TABLET(40 MG) BY MOUTH DAILY (Patient taking differently: Take 40 mg by mouth daily.) 90 tablet 1   vitamin C (ASCORBIC ACID) 500 MG tablet Take 500 mg by mouth daily.     aspirin EC 81 MG tablet Take 1 tablet (81 mg total) by mouth 2 (two) times daily. To prevent blood clots for 30 days after surgery. (Patient not taking: Reported on 12/06/2021) 60 tablet 0   meloxicam (MOBIC) 15 MG tablet Take 1 tablet (15 mg total) by mouth daily as needed for pain (and inflammation). (Patient not taking: Reported on 12/06/2021) 30 tablet 0   methocarbamol (ROBAXIN-750) 750 MG tablet  Take 1 tablet (750 mg total) by mouth every 8 (eight) hours as needed for muscle spasms. (Patient not taking: Reported on 12/06/2021) 20 tablet 0   ondansetron (ZOFRAN-ODT) 4 MG disintegrating tablet Take 1 tablet (4 mg total) by mouth 2 (two) times daily as needed for nausea or vomiting. (Patient not taking: Reported on 12/06/2021) 10 tablet 0   oxyCODONE (ROXICODONE) 5 MG immediate release tablet Take 1 tablet (5 mg total) by mouth every 6 (six) hours as needed for severe pain. Do not take more than 6 tablets in a 24 hour period. (Patient not taking: Reported on 12/06/2021) 28 tablet 0   No facility-administered medications prior to visit.    Allergies  Allergen Reactions   Latex Rash    ROS As per HPI  PE:    12/06/2021    2:42 PM 09/28/2021    4:00 PM 09/28/2021    3:45 PM  Vitals with BMI  Height '5\' 5"'$     Weight 129 lbs 13 oz    BMI 76.2    Systolic  831 517  Diastolic  82 65  Pulse  59 62     Physical Exam  Gen: Alert, well appearing.  Patient is oriented to person, place, time, and situation. AFFECT: pleasant, lucid thought and speech. CV: RRR, no m/r/g.   LUNGS: CTA bilat, nonlabored resps, good aeration in all lung fields.   LABS:  Last CBC Lab Results  Component Value Date   WBC 7.0 09/28/2021   HGB 13.8 09/28/2021   HCT 42.0 09/28/2021   MCV 95.0 09/28/2021   MCH 31.2 09/28/2021   RDW 12.4 09/28/2021   PLT 222 61/60/7371   Last metabolic panel Lab Results  Component Value Date   GLUCOSE 108 (H) 05/03/2021   NA 137 05/03/2021   K 3.5 05/03/2021   CL 98 05/03/2021   CO2 33 (H) 05/03/2021   BUN 12 05/03/2021   CREATININE 0.69 05/03/2021   GFRNONAA >60 05/03/2021   CALCIUM 9.4 05/03/2021   PROT 7.1 04/01/2020   ALBUMIN 4.7 04/01/2020   BILITOT 0.4 04/01/2020   ALKPHOS 72 04/01/2020   AST 20 04/01/2020   ALT 18 04/01/2020   ANIONGAP 6 05/03/2021   Last vitamin D Lab Results  Component Value Date   VD25OH 74.99 04/09/2021   IMPRESSION AND  PLAN:  #1 GAD, stable on 1/2-1 alprazolam 0.5 mg tab twice daily as needed. Controlled substance contract updated today and urine drug screen obtained.  #2 GERD.  Hx of gastritis with IDA (but no convincing site of bleeding to explain IDA, plus her hemoccults were neg): she has done well since 2019. No  longer on iron supplement. Hemoglobin 13.8 when in the hospital for surgery 09/28/2021. Continue pantoprazole 40 mg a day.  Renewed prescription today.  #3 elevated blood pressure without diagnosis of hypertension. Review of hospital blood pressure shows somewhat up but some are well within normal limits. Anxiety/whitecoat hypertension could be playing a role today, acute pain playing a role in hospital. Recommended she check blood pressure at home daily for the next week or 2 and call or return if not at the goal of 130/80 or better.  #4 history of hip fracture. She is status post bilateral total hip arthroplasty. Doing well. DEXA has been done by orthopedist, results pending.  An After Visit Summary was printed and given to the patient.  FOLLOW UP: No follow-ups on file.  Signed:  [name], MD           12/06/2021

## 2022-01-13 DIAGNOSIS — M810 Age-related osteoporosis without current pathological fracture: Secondary | ICD-10-CM | POA: Diagnosis not present

## 2022-01-18 DIAGNOSIS — J069 Acute upper respiratory infection, unspecified: Secondary | ICD-10-CM | POA: Diagnosis not present

## 2022-01-18 DIAGNOSIS — R051 Acute cough: Secondary | ICD-10-CM | POA: Diagnosis not present

## 2022-01-26 NOTE — Progress Notes (Signed)
This encounter was created in error - please disregard.

## 2022-04-11 ENCOUNTER — Telehealth: Payer: Self-pay | Admitting: Family Medicine

## 2022-04-11 NOTE — Telephone Encounter (Signed)
Copied from [place] [id number] [id number]. Topic: Medicare AWV >> Apr 11, 2022 11:57 AM Gillis Santa wrote: Reason for CRM: Called patient to schedule Medicare Annual Wellness Visit (AWV). Left message for patient to call back and schedule Medicare Annual Wellness Visit (AWV).  Last date of AWV: 04/10/2021  Please schedule an appointment at any time with Health coach Otila Kluver. Needs to be a tele visit only Wednesdays only.    If any questions, please contact me at [phone].  Thank you ,  Shaune Pollack [facility] AWV TEAM Direct Dial [phone]

## 2022-05-18 ENCOUNTER — Telehealth: Payer: Self-pay | Admitting: Family Medicine

## 2022-05-18 NOTE — Telephone Encounter (Signed)
Noted  

## 2022-05-18 NOTE — Telephone Encounter (Signed)
Patient called to report she will be having a dental procedure next week and her dentist prescribed her  Amoxicillin. She has been on the Amoxicillin for three days , however she reports that her neck is swelling and it does this mostly at night. She denies an allergic reaction to the medication and her dentist advised her to call her PCP and told her she needs an steroid. I informed her that Dr. Milinda Cave would need to have an exam with her to determine what is the best treatment option. Patient declined appointment and said she was not coming in for this and that she was watching her grandchildren. She said she would call back tomorrow if it is worse.

## 2022-05-24 DIAGNOSIS — D225 Melanocytic nevi of trunk: Secondary | ICD-10-CM | POA: Diagnosis not present

## 2022-05-24 DIAGNOSIS — D2261 Melanocytic nevi of right upper limb, including shoulder: Secondary | ICD-10-CM | POA: Diagnosis not present

## 2022-05-24 DIAGNOSIS — D224 Melanocytic nevi of scalp and neck: Secondary | ICD-10-CM | POA: Diagnosis not present

## 2022-05-24 DIAGNOSIS — L578 Other skin changes due to chronic exposure to nonionizing radiation: Secondary | ICD-10-CM | POA: Diagnosis not present

## 2022-05-24 DIAGNOSIS — L821 Other seborrheic keratosis: Secondary | ICD-10-CM | POA: Diagnosis not present

## 2022-05-24 DIAGNOSIS — D2371 Other benign neoplasm of skin of right lower limb, including hip: Secondary | ICD-10-CM | POA: Diagnosis not present

## 2022-05-24 DIAGNOSIS — D485 Neoplasm of uncertain behavior of skin: Secondary | ICD-10-CM | POA: Diagnosis not present

## 2022-05-24 DIAGNOSIS — Z85820 Personal history of malignant melanoma of skin: Secondary | ICD-10-CM | POA: Diagnosis not present

## 2022-05-24 DIAGNOSIS — L723 Sebaceous cyst: Secondary | ICD-10-CM | POA: Diagnosis not present

## 2022-06-01 ENCOUNTER — Other Ambulatory Visit: Payer: Self-pay | Admitting: Family Medicine

## 2022-06-20 ENCOUNTER — Other Ambulatory Visit: Payer: Self-pay

## 2022-06-20 MED ORDER — ALPRAZOLAM 0.5 MG PO TABS
ORAL_TABLET | ORAL | 1 refills | Status: DC
Start: 2022-06-20 — End: 2022-08-15

## 2022-06-20 NOTE — Telephone Encounter (Signed)
Requesting: alprazolam Contract: 12/06/21 UDS: 12/06/21 Last Visit: 12/06/21 Next Visit: 6 mo f/u cpe Last Refill: 12/06/21 (180,1)  Please Advise. Med pending

## 2022-06-28 ENCOUNTER — Telehealth: Payer: Self-pay | Admitting: Family Medicine

## 2022-06-28 NOTE — Telephone Encounter (Signed)
Pt does not have GYN. Records requested from Ortho

## 2022-06-28 NOTE — Telephone Encounter (Signed)
Please request bone density scan results from patient's GYN MD.   (Patient's dermatologist sent correspondence requesting that I start her on Fosamax or other bone protection/treatment for osteoporosis. There is statement was that she suffers from a condition that requires a medication that may interfere with one of the medications that I prescribed or conditions and I am treating.  No specifics were given in this correspondence.  I reviewed my last CPE for her done on 04/09/2021--->"Osteoporosis screening: via her GYN-->was set to start prolia but had hip fx first. We'll plan starting this after her surgery and rehab is done.")

## 2022-07-10 DIAGNOSIS — M1812 Unilateral primary osteoarthritis of first carpometacarpal joint, left hand: Secondary | ICD-10-CM | POA: Diagnosis not present

## 2022-07-10 DIAGNOSIS — S5012XA Contusion of left forearm, initial encounter: Secondary | ICD-10-CM | POA: Diagnosis not present

## 2022-07-10 DIAGNOSIS — S59912A Unspecified injury of left forearm, initial encounter: Secondary | ICD-10-CM | POA: Diagnosis not present

## 2022-07-10 DIAGNOSIS — S6992XA Unspecified injury of left wrist, hand and finger(s), initial encounter: Secondary | ICD-10-CM | POA: Diagnosis not present

## 2022-07-14 ENCOUNTER — Telehealth: Payer: Self-pay

## 2022-07-14 NOTE — Telephone Encounter (Signed)
A user error has taken place: duplicate encounter opened in error, closed for administrative reasons.

## 2022-07-14 NOTE — Telephone Encounter (Signed)
LVM for pt to return call. She is currently overdue for her next appointment (CPE), she is also due for repeat mammogram, bone density, and AWV.   Note: if pt returns call, please assist with scheduling, thanks.

## 2022-07-18 ENCOUNTER — Telehealth: Payer: Self-pay

## 2022-07-18 ENCOUNTER — Other Ambulatory Visit: Payer: Self-pay | Admitting: Family Medicine

## 2022-07-18 NOTE — Telephone Encounter (Signed)
OPENED IN ERROR

## 2022-07-26 ENCOUNTER — Other Ambulatory Visit: Payer: Self-pay | Admitting: Family Medicine

## 2022-07-27 ENCOUNTER — Ambulatory Visit: Payer: Medicare PPO | Admitting: Family Medicine

## 2022-07-27 DIAGNOSIS — E559 Vitamin D deficiency, unspecified: Secondary | ICD-10-CM | POA: Diagnosis not present

## 2022-07-27 DIAGNOSIS — R5383 Other fatigue: Secondary | ICD-10-CM | POA: Diagnosis not present

## 2022-07-27 DIAGNOSIS — M810 Age-related osteoporosis without current pathological fracture: Secondary | ICD-10-CM | POA: Diagnosis not present

## 2022-07-27 DIAGNOSIS — S5012XA Contusion of left forearm, initial encounter: Secondary | ICD-10-CM | POA: Diagnosis not present

## 2022-07-28 ENCOUNTER — Other Ambulatory Visit: Payer: Self-pay

## 2022-07-28 DIAGNOSIS — M810 Age-related osteoporosis without current pathological fracture: Secondary | ICD-10-CM | POA: Insufficient documentation

## 2022-07-28 DIAGNOSIS — S5012XA Contusion of left forearm, initial encounter: Secondary | ICD-10-CM | POA: Diagnosis not present

## 2022-08-08 ENCOUNTER — Other Ambulatory Visit: Payer: Self-pay

## 2022-08-08 ENCOUNTER — Other Ambulatory Visit (HOSPITAL_COMMUNITY): Payer: Self-pay

## 2022-08-08 ENCOUNTER — Telehealth: Payer: Self-pay | Admitting: Pharmacy Technician

## 2022-08-08 NOTE — Telephone Encounter (Signed)
Auth Submission: NO AUTH NEEDED Site of care: Site of care: [facility] Payer: [employer] MEDICARE Medication & CPT/J Code(s) submitted: Reclast (Zolendronic acid) [id number] Route of submission (phone, fax, portal):  Phone # Fax # Auth type: Buy/Bill Units/visits requested: X1 Reference number:  Approval from: 08/08/22 to 01/31/23

## 2022-08-12 NOTE — Patient Instructions (Signed)

## 2022-08-15 ENCOUNTER — Encounter: Payer: Self-pay | Admitting: Family Medicine

## 2022-08-15 ENCOUNTER — Ambulatory Visit: Payer: Medicare PPO | Admitting: Family Medicine

## 2022-08-15 VITALS — BP 140/80 | HR 81 | Wt 125.0 lb

## 2022-08-15 DIAGNOSIS — K219 Gastro-esophageal reflux disease without esophagitis: Secondary | ICD-10-CM

## 2022-08-15 DIAGNOSIS — M810 Age-related osteoporosis without current pathological fracture: Secondary | ICD-10-CM

## 2022-08-15 DIAGNOSIS — S5012XA Contusion of left forearm, initial encounter: Secondary | ICD-10-CM | POA: Diagnosis not present

## 2022-08-15 DIAGNOSIS — F411 Generalized anxiety disorder: Secondary | ICD-10-CM

## 2022-08-15 DIAGNOSIS — R030 Elevated blood-pressure reading, without diagnosis of hypertension: Secondary | ICD-10-CM

## 2022-08-15 MED ORDER — PANTOPRAZOLE SODIUM 40 MG PO TBEC: 40.0000 mg | DELAYED_RELEASE_TABLET | Freq: Every day | ORAL | 3 refills | Status: DC

## 2022-08-15 MED ORDER — ALPRAZOLAM 0.5 MG PO TABS: ORAL_TABLET | ORAL | 1 refills | Status: DC

## 2022-08-15 NOTE — Progress Notes (Signed)
OFFICE VISIT  08/15/2022  CC:  Chief Complaint  Patient presents with   Medical Management of Chronic Issues    Patient is a 72 y.o. female who presents for follow-up anxiety. She has done well long-term on Xanax. A/P as of last visit 12/06/21: "#1 GAD, stable on 1/2-1 alprazolam 0.5 mg tab twice daily as needed. Controlled substance contract updated today and urine drug screen obtained.   #2 GERD.  Hx of gastritis with IDA (but no convincing site of bleeding to explain IDA, plus her hemoccults were neg): she has done well since 2019. No longer on iron supplement. Hemoglobin 13.8 when in the hospital for surgery 09/28/2021. Continue pantoprazole 40 mg a day.  Renewed prescription today.   #3 elevated blood pressure without diagnosis of hypertension. Review of hospital blood pressure shows somewhat up but some are well within normal limits. Anxiety/whitecoat hypertension could be playing a role today, acute pain playing a role in hospital. Recommended she check blood pressure at home daily for the next week or 2 and call or return if not at the goal of 130/80 or better.   #4 history of hip fracture. She is status post bilateral total hip arthroplasty. Doing well. DEXA has been done by orthopedist, results pending."  INTERIM HX: Anxiety stable, takes alprazolam 0.51-2 times a day. Pantoprazole 40 mg daily is helpful for her GERD.  Slipped and fell in shower about 3 weeks ago, hematoma L arm[place]  No pain.  This has been diminishing gradually.  She saw Dr. Cleophas Dunker in sports medicine.  She had a DEXA which showed osteoporosis.  Patient states she is starting zoledronic acid soon.  She takes vitamin D and calcium.  PMP AWARE reviewed today: most recent rx for alprazolam 0.5 mg was filled 07/20/2022, # 60, rx by me. No red flags.  ROS as above, plus--> no fevers, no CP, no SOB, no wheezing, no cough, no dizziness, no HAs, no rashes, no melena/hematochezia.  No  polyuria or polydipsia.  No myalgias or arthralgias.  No focal weakness, paresthesias, or tremors.  No acute vision or hearing abnormalities.  No dysuria or unusual/new urinary urgency or frequency.  No recent changes in lower legs. No n/v/d or abd pain.  No palpitations.    Past Medical History:  Diagnosis Date   Allergic rhinitis    Anxiety    Arthritis    low back and both hands   Basal cell carcinoma    forehead - removed in office   COPD (chronic obstructive pulmonary disease) ([facility])    asymptomatic BUT has purse-lipped breathing on exam even when feeling well   COVID 2021   on prednisone  and antibiotics   Gastritis 08/17/2017   EGD   GERD (gastroesophageal reflux disease)    Iron deficiency anemia 05/2017     Hemoccults neg 05/26/17.  Colonoscopy -->no culprit.  EGD--> gastritis (h pyl NEG).   Melanoma ([facility])    Near umbilicus. close f/u with Dr. Emily Filbert (annual)   Pneumonia    x 1   S/P right hip fracture 10/2020   Tobacco dependence     Past Surgical History:  Procedure Laterality Date   CESAREAN SECTION  1981   spinal   COLONOSCOPY  08/17/2017   Done for unexplained IDA-->Diverticulosis, non-bleeding internal hem, o/w normal.  Repeat 10 yrs.   ESOPHAGOGASTRODUODENOSCOPY  08/17/2017   +Gastritis.  Gastric bx "reactive gastritis".  Bx for H pylori NEG.  Duodenum bx: NORMAL.   EYE SURGERY Left    lasik   HIP PINNING,CANNULATED Right 11/26/2020   Procedure: PERCUTANEOUS PINNING EXTREMITYproximal femur;  Surgeon: Teryl Lucy, MD;  Location: [facility];  Service: Orthopedics;  Laterality: Right;   MELANOMA EXCISION     abd midline below belly button   TOTAL HIP ARTHROPLASTY Right 05/04/2021   Procedure: TOTAL ANTERIOR APPROACH HIP ARTHROPLASTY WITH SCREW REMOVAL;  Surgeon: Sheral Apley, MD;  Location: [facility];  Service: Orthopedics;  Laterality: Right;   TOTAL HIP ARTHROPLASTY Left 09/28/2021   Procedure: TOTAL HIP ARTHROPLASTY ANTERIOR APPROACH;  Surgeon: Sheral Apley, MD;  Location: [facility];  Service: Orthopedics;  Laterality: Left;    Outpatient Medications Prior to Visit  Medication Sig Dispense Refill   acetaminophen (TYLENOL) 500 MG tablet Take 2 tablets (1,000 mg total) by mouth every 6 (six) hours as needed for moderate pain or mild pain. 60 tablet 0   ALPRAZolam (XANAX) 0.5 MG tablet TAKE 1/2 TO 1 TABLET BY MOUTH TWICE DAILY AS NEEDED FOR ANXIETY 180 tablet 1   Biotin w/ Vitamins C & E (HAIR/SKIN/NAILS PO) Take 1 tablet by mouth daily.     Calcium-Vitamin D 500-3.125 MG-MCG TABS Take 1 tablet by mouth daily.     cetirizine (ZYRTEC) 10 MG tablet Take 10 mg by mouth in the morning.     cholecalciferol (VITAMIN D) 1000 units tablet Take 1,000 Units by mouth daily.     pantoprazole (PROTONIX) 40 MG tablet TAKE 1 TABLET BY MOUTH DAILY 30 tablet 1   vitamin C (ASCORBIC ACID) 500 MG tablet Take 500 mg by mouth daily.     No facility-administered medications prior to visit.    Allergies  Allergen Reactions   Latex Rash    Review of Systems As per HPI  PE:    08/15/2022    2:52 PM 08/15/2022    2:44 PM 12/06/2021    2:42 PM  Vitals with BMI  Height   5\' 5"   Weight  125 lbs 129 lbs 13 oz  BMI   21.6  Systolic 140 159 161  Diastolic 80 100 77  Pulse  81 72     Physical Exam  Gen: Alert, well appearing.  Patient is oriented to person, place, time, and situation. AFFECT: pleasant, lucid thought and speech. Proximal left forearm with about golf ball size subcutaneous soft nodularity, fluctuant, nontender.  Ecchymoses overlying this. Proximal and distal strength in the forearm are normal.  Wrist and hand strength normal.  LABS:  None today  IMPRESSION AND PLAN:  #1 GAD.  Doing well on Xanax 0.5 mg twice daily as needed long-term. Controlled substance contract and urine drug screen up-to-date.  #2 GERD, well-controlled on daily pantoprazole 40 mg.  3.  Osteoporosis, history of hip fracture.  She is on calcium and vitamin D  and will be starting annual zoledronic acid infusion soon.  Followed by Dr. Cleophas Dunker.  #4 left forearm hematoma. Gradually resolving.  #5 elevated blood pressure without diagnosis of hypertension. Her plan is to have her get a blood pressure cuff and monitor at home.  #6 preventative health care: Breast cancer screening: She will be setting up her mammogram soon. Colon cancer screening: No polyps on colonoscopy 2019. Lung cancer screening: She declines at this time.  Will obtain DEXA records from Dr. Cleophas Dunker and most recent lab work done by her orthopedist.  An After Visit Summary was printed and given to the patient.  FOLLOW UP:  No follow-ups on file.  Signed:  Santiago Bumpers, MD           08/15/2022

## 2022-08-25 ENCOUNTER — Ambulatory Visit: Payer: Medicare PPO

## 2022-08-25 VITALS — BP 171/70 | HR 62 | Temp 98.2°F | Resp 18 | Ht 65.0 in | Wt 124.6 lb

## 2022-08-25 DIAGNOSIS — M810 Age-related osteoporosis without current pathological fracture: Secondary | ICD-10-CM

## 2022-08-25 MED ORDER — SODIUM CHLORIDE 0.9 % IV SOLN: INTRAVENOUS | Status: DC

## 2022-08-25 MED ORDER — DIPHENHYDRAMINE HCL 25 MG PO CAPS
25.0000 mg | ORAL_CAPSULE | Freq: Once | ORAL | Status: AC
  Administered 2022-08-25: 25 mg via ORAL
  Filled 2022-08-25: qty 1

## 2022-08-25 MED ORDER — ACETAMINOPHEN 325 MG PO TABS
650.0000 mg | ORAL_TABLET | Freq: Once | ORAL | Status: AC
  Administered 2022-08-25: 650 mg via ORAL
  Filled 2022-08-25: qty 2

## 2022-08-25 MED ORDER — ZOLEDRONIC ACID 5 MG/100ML IV SOLN
5.0000 mg | Freq: Once | INTRAVENOUS | Status: AC
  Administered 2022-08-25: 5 mg via INTRAVENOUS
  Filled 2022-08-25: qty 100

## 2022-08-25 NOTE — Progress Notes (Signed)
Diagnosis: Osteoporosis  Provider:  Chilton Greathouse MD  Procedure: IV Infusion  IV Type: Peripheral, IV Location: L Antecubital  Reclast (Zolendronic Acid), Dose: 5 mg  Infusion Start Time: 1444  Infusion Stop Time: 1512  Post Infusion IV Care: Patient declined observation and Peripheral IV Discontinued  Discharge: Condition: Good, Destination: Home . AVS Provided  Performed by:  Rico Ala, LPN

## 2022-08-25 NOTE — Patient Instructions (Signed)

## 2022-09-06 ENCOUNTER — Other Ambulatory Visit: Payer: Self-pay | Admitting: Family Medicine

## 2022-09-06 DIAGNOSIS — Z1231 Encounter for screening mammogram for malignant neoplasm of breast: Secondary | ICD-10-CM

## 2022-10-21 IMAGING — RF DG HIP (WITH OR WITHOUT PELVIS) 1V*R*
1 series · 2 of 2 positions shown · non-contrast
Comparison: Prior intraoperative radiographs 11/26/2020

CLINICAL DATA: Right hip arthroplasty

EXAM:
DG HIP (WITH OR WITHOUT PELVIS) 1V RIGHT; DG C-ARM 1-60 MIN-NO
REPORT

[Series 1: unknown protocol · 0.20mm/px · 2 of 2 slices shown]
[im 1/2]
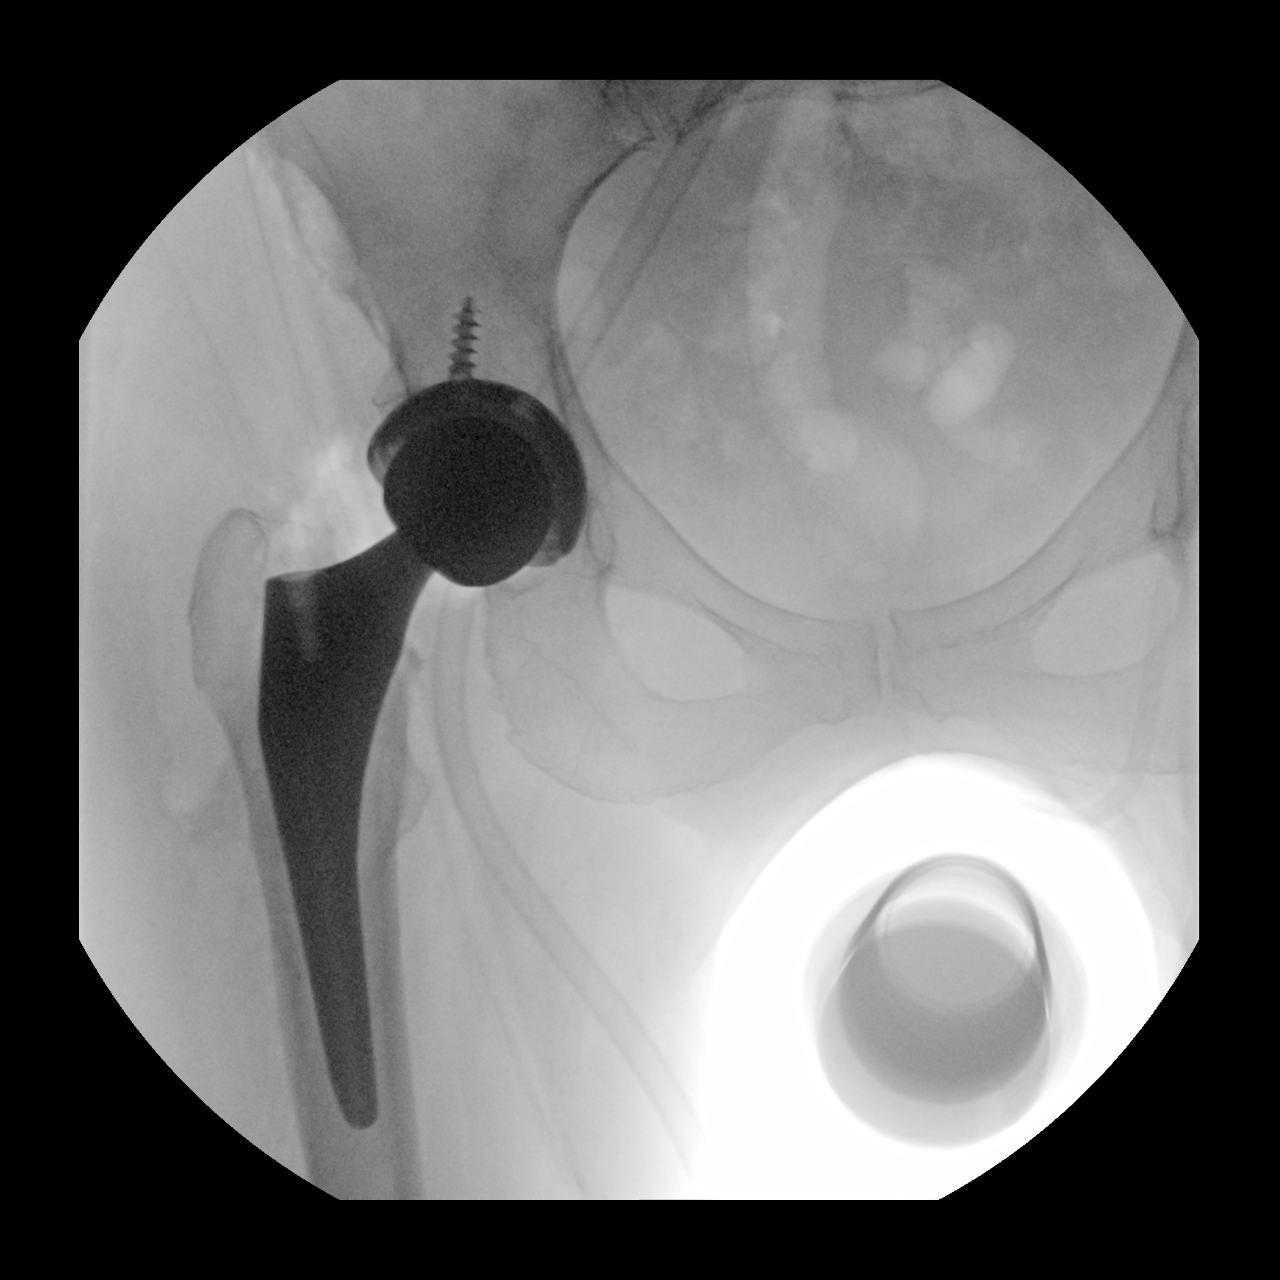
[im 2/2]
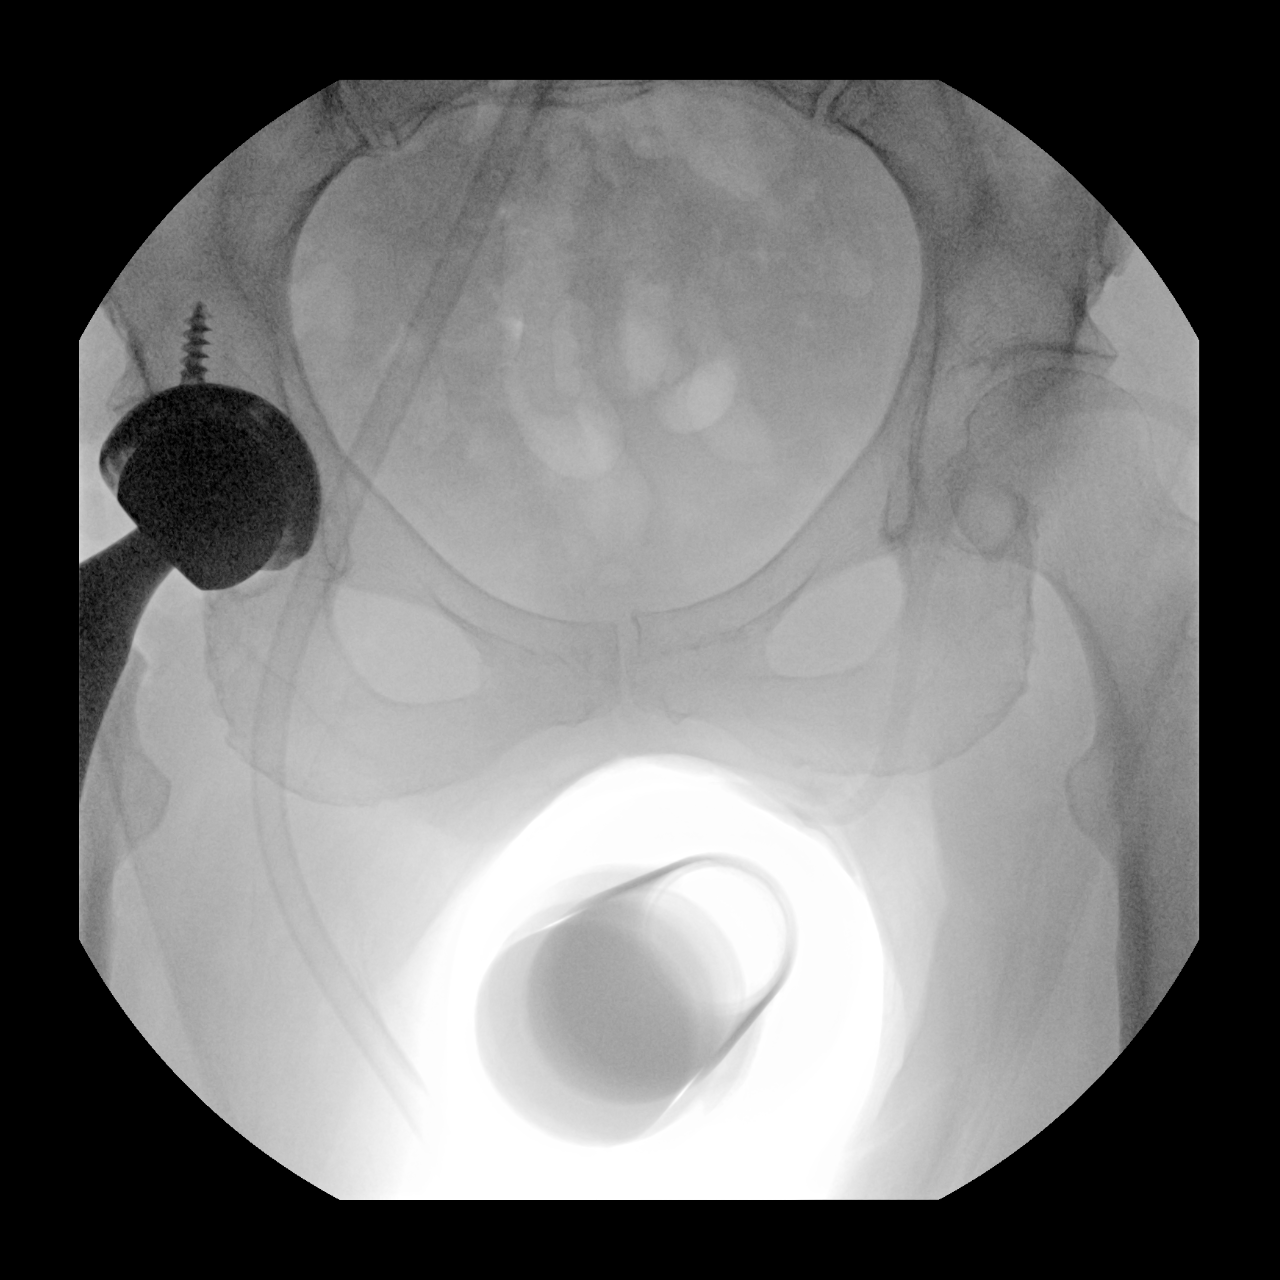

[2 of 2 positions shown; findings below may reference images not displayed]

FINDINGS: Interval removal previously placed trans femoral neck cannulated lag
screws and in process total hip arthroplasty. No evidence of
immediate hardware complication.
IMPRESSION: In progress right total hip arthroplasty without evidence of
immediate complication.

## 2022-12-30 DIAGNOSIS — J209 Acute bronchitis, unspecified: Secondary | ICD-10-CM | POA: Diagnosis not present

## 2022-12-30 DIAGNOSIS — R051 Acute cough: Secondary | ICD-10-CM | POA: Diagnosis not present

## 2023-01-30 ENCOUNTER — Encounter: Payer: Self-pay | Admitting: Sports Medicine

## 2023-02-21 ENCOUNTER — Telehealth: Payer: Self-pay | Admitting: Family Medicine

## 2023-02-21 MED ORDER — ALPRAZOLAM 0.5 MG PO TABS: ORAL_TABLET | ORAL | 1 refills | Status: DC

## 2023-02-21 NOTE — Telephone Encounter (Signed)
Copied from CRM [id number]. Topic: Clinical - Medication Refill >> Feb 21, 2023 10:41 AM Theodis Sato wrote: Most Recent Primary Care Visit:  Provider: Jeoffrey Massed  Department: [facility]  Visit Type: OFFICE VISIT  Date: 08/15/2022  Medication: ALPRAZolam Prudy Feeler) 0.5 MG tablet  Has the patient contacted their pharmacy? Yes - Prescription is expired.  (Agent: If no, request that the patient contact the pharmacy for the refill. If patient does not wish to contact the pharmacy document the reason why and proceed with request.) (Agent: If yes, when and what did the pharmacy advise?)  Is this the correct pharmacy for this prescription? Yes If no, delete pharmacy and type the correct one.  This is the patient's preferred pharmacy:  [id number] [place], [place] - [address] AT [address] & [address] [place] [address] Phone: [phone] Fax: [phone]   Has the prescription been filled recently? Yes  Has the patient been seen for an appointment in the last year OR does the patient have an upcoming appointment? Yes  Can we respond through MyChart? No  Agent: Please be advised that Rx refills may take up to 3 business days. We ask that you follow-up with your pharmacy.

## 2023-02-21 NOTE — Addendum Note (Signed)
Addended by: Jeoffrey Massed on: 02/21/2023 12:17 PM   Modules accepted: Orders

## 2023-02-22 ENCOUNTER — Ambulatory Visit: Payer: Medicare PPO | Admitting: Family Medicine

## 2023-02-22 ENCOUNTER — Encounter: Payer: Self-pay | Admitting: Family Medicine

## 2023-02-22 VITALS — BP 136/75 | HR 79 | Ht 64.0 in | Wt 123.8 lb

## 2023-02-22 DIAGNOSIS — Z23 Encounter for immunization: Secondary | ICD-10-CM | POA: Diagnosis not present

## 2023-02-22 DIAGNOSIS — F411 Generalized anxiety disorder: Secondary | ICD-10-CM | POA: Diagnosis not present

## 2023-02-22 DIAGNOSIS — Z79899 Other long term (current) drug therapy: Secondary | ICD-10-CM | POA: Diagnosis not present

## 2023-02-22 DIAGNOSIS — F5105 Insomnia due to other mental disorder: Secondary | ICD-10-CM

## 2023-02-22 DIAGNOSIS — Z0000 Encounter for general adult medical examination without abnormal findings: Secondary | ICD-10-CM | POA: Diagnosis not present

## 2023-02-22 DIAGNOSIS — Z1231 Encounter for screening mammogram for malignant neoplasm of breast: Secondary | ICD-10-CM

## 2023-02-22 DIAGNOSIS — Z720 Tobacco use: Secondary | ICD-10-CM

## 2023-02-22 DIAGNOSIS — F99 Mental disorder, not otherwise specified: Secondary | ICD-10-CM

## 2023-02-22 DIAGNOSIS — R052 Subacute cough: Secondary | ICD-10-CM | POA: Diagnosis not present

## 2023-02-22 DIAGNOSIS — J209 Acute bronchitis, unspecified: Secondary | ICD-10-CM

## 2023-02-22 LAB — CBC WITH DIFFERENTIAL/PLATELET
Basophils Absolute: 0 10*3/uL (ref 0.0–0.1)
Basophils Relative: 0.5 % (ref 0.0–3.0)
Eosinophils Absolute: 0.1 10*3/uL (ref 0.0–0.7)
Eosinophils Relative: 1.3 % (ref 0.0–5.0)
HCT: 45.3 % (ref 36.0–46.0)
Hemoglobin: 14.8 g/dL (ref 12.0–15.0)
Lymphocytes Relative: 28.3 % (ref 12.0–46.0)
Lymphs Abs: 2.1 10*3/uL (ref 0.7–4.0)
MCHC: 32.7 g/dL (ref 30.0–36.0)
MCV: 96.2 fL (ref 78.0–100.0)
Monocytes Absolute: 0.6 10*3/uL (ref 0.1–1.0)
Monocytes Relative: 8.3 % (ref 3.0–12.0)
Neutro Abs: 4.5 10*3/uL (ref 1.4–7.7)
Neutrophils Relative %: 61.6 % (ref 43.0–77.0)
Platelets: 262 10*3/uL (ref 150.0–400.0)
RBC: 4.71 Mil/uL (ref 3.87–5.11)
RDW: 12.8 % (ref 11.5–15.5)
WBC: 7.3 10*3/uL (ref 4.0–10.5)

## 2023-02-22 LAB — COMPREHENSIVE METABOLIC PANEL
ALT: 14 U/L (ref 0–35)
AST: 17 U/L (ref 0–37)
Albumin: 4.8 g/dL (ref 3.5–5.2)
Alkaline Phosphatase: 46 U/L (ref 39–117)
BUN: 15 mg/dL (ref 6–23)
CO2: 33 meq/L — ABNORMAL HIGH (ref 19–32)
Calcium: 9.9 mg/dL (ref 8.4–10.5)
Chloride: 102 meq/L (ref 96–112)
Creatinine, Ser: 0.67 mg/dL (ref 0.40–1.20)
GFR: 88.78 mL/min (ref >60.00)
Glucose, Bld: 86 mg/dL (ref 70–99)
Potassium: 4.8 meq/L (ref 3.5–5.1)
Sodium: 142 meq/L (ref 135–145)
Total Bilirubin: 0.5 mg/dL (ref 0.2–1.2)
Total Protein: 7 g/dL (ref 6.0–8.3)

## 2023-02-22 LAB — LIPID PANEL
Cholesterol: 188 mg/dL (ref 0–200)
HDL: 89.4 mg/dL (ref >39.00)
LDL Cholesterol: 77 mg/dL (ref 0–99)
NonHDL: 99.06
Total CHOL/HDL Ratio: 2
Triglycerides: 111 mg/dL (ref 0.0–149.0)
VLDL: 22.2 mg/dL (ref 0.0–40.0)

## 2023-02-22 MED ORDER — PREDNISONE 20 MG PO TABS: ORAL_TABLET | ORAL | 0 refills | Status: DC

## 2023-02-22 NOTE — Progress Notes (Signed)
Office Note 02/22/2023  CC:  Chief Complaint  Patient presents with   Annual Exam    Pt is not fasting.     HPI:  Patient is a 73 y.o. female who is here for annual health maintenance exam and follow-up GAD, high risk medication use. She has had approximately 6 weeks of persistent cough.  Initially felt like there was a rattling chest congestion as well.  No fever, no shortness of breath.  She does have some nasal congestion/runny nose which she has been taking an antihistamine for. She has been taking some DayQuil as well as some Mucinex at times. She was seen early in this illness at an urgent care and prescribed an antibiotic (she does not recall the name) and some steroids. She really does not feel like her symptoms have improved any.  Anxiety well-controlled. PMP AWARE reviewed today: most recent rx for alprazolam 0.5 mg was filled 01/14/2023, # 60, rx by me. No red flags.   Past Medical History:  Diagnosis Date   Allergic rhinitis    Anxiety    Arthritis    low back and both hands   Basal cell carcinoma    forehead - removed in office   COPD (chronic obstructive pulmonary disease) (HCC)    asymptomatic BUT has purse-lipped breathing on exam even when feeling well   COVID 2021   on prednisone  and antibiotics   Gastritis 08/17/2017   EGD   GERD (gastroesophageal reflux disease)    Iron deficiency anemia 05/2017     Hemoccults neg 05/26/17.  Colonoscopy -->no culprit.  EGD--> gastritis (h pyl NEG).   Melanoma (HCC)    Near umbilicus. close f/u with Dr. Emily Filbert (annual)   Osteoporosis    + hip fracture 2023   Pneumonia    x 1   S/P right hip fracture 10/2020   Tobacco dependence     Past Surgical History:  Procedure Laterality Date   CESAREAN SECTION  1981   spinal   COLONOSCOPY  08/17/2017   Done for unexplained IDA-->Diverticulosis, non-bleeding internal hem, o/w normal.  Repeat 10 yrs.   ESOPHAGOGASTRODUODENOSCOPY  08/17/2017   +Gastritis.  Gastric bx  "reactive gastritis".  Bx for H pylori NEG.        Duodenum bx: NORMAL.   EYE SURGERY Left    lasik   HIP PINNING,CANNULATED Right 11/26/2020   Procedure: PERCUTANEOUS PINNING EXTREMITYproximal femur;  Surgeon: Teryl Lucy, MD;  Location: [facility];  Service: Orthopedics;  Laterality: Right;   MELANOMA EXCISION     abd midline below belly button   TOTAL HIP ARTHROPLASTY Right 05/04/2021   Procedure: TOTAL ANTERIOR APPROACH HIP ARTHROPLASTY WITH SCREW REMOVAL;  Surgeon: Sheral Apley, MD;  Location: [facility];  Service: Orthopedics;  Laterality: Right;   TOTAL HIP ARTHROPLASTY Left 09/28/2021   Procedure: TOTAL HIP ARTHROPLASTY ANTERIOR APPROACH;  Surgeon: Sheral Apley, MD;  Location: [facility];  Service: Orthopedics;  Laterality: Left;    Family History  Problem Relation Age of Onset   Arthritis Mother    Asthma Mother    COPD Mother    Hypertension Mother    Cancer Father        liver ca   Alcohol abuse Father    Alzheimer's disease Father    Cancer Brother        Leukemia 2017   Hearing loss Brother    Early death Brother    Alcohol abuse Maternal Grandmother    Cancer Maternal Grandmother  Cancer Maternal Grandfather        lung   Alzheimer's disease Paternal Grandmother    Cancer Paternal Grandfather        lung   Asthma Daughter     Social History   Socioeconomic History   Marital status: Married    Spouse name: Not on file   Number of children: Not on file   Years of education: Not on file   Highest education level: Bachelor's degree (e.g., BA, AB, BS)  Occupational History   Occupation: Radio broadcast assistant  Tobacco Use   Smoking status: Every Day    Current packs/day: 0.50    Average packs/day: 0.5 packs/day for 30.0 years (15.0 ttl pk-yrs)    Types: Cigarettes   Smokeless tobacco: Never  Vaping Use   Vaping status: Former  Substance and Sexual Activity   Alcohol use: Yes    Alcohol/week: 7.0 standard drinks of alcohol    Types: 7 Glasses of wine per  week    Comment: occasional wine   Drug use: Never   Sexual activity: Yes    Partners: Male    Birth control/protection: Post-menopausal  Other Topics Concern   Not on file  Social History Narrative   Married (her pt is a husband of mine as well), 3 children, 1 grand child.   Educ: college.   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   Tob: current as of 05/2017; 15 pack-yr hx.   NUU:[id number]   Social Drivers of Health   Financial Resource Strain: Low Risk  (04/10/2021)   Overall Financial Resource Strain (CARDIA)    Difficulty of Paying Living Expenses: Not hard at all  Food Insecurity: No Food Insecurity (04/10/2021)   Hunger Vital Sign    Worried About Running Out of Food in the Last Year: Never true    Ran Out of Food in the Last Year: Never true  Transportation Needs: No Transportation Needs (04/10/2021)   PRAPARE - Administrator, Civil Service (Medical): No    Lack of Transportation (Non-Medical): No  Physical Activity: Insufficiently Active (04/10/2021)   Exercise Vital Sign    Days of Exercise per Week: 1 day    Minutes of Exercise per Session: 10 min  Stress: Stress Concern Present (04/10/2021)   [employer] of Occupational Health - Occupational Stress Questionnaire    Feeling of Stress : To some extent  Social Connections: Moderately Integrated (04/10/2021)   Social Connection and Isolation Panel [NHANES]    Frequency of Communication with Friends and Family: More than three times a week    Frequency of Social Gatherings with Friends and Family: Three times a week    Attends Religious Services: 1 to 4 times per year    Active Member of Clubs or Organizations: No    Attends Banker Meetings: Never    Marital Status: Married  Catering manager Violence: Not At Risk (04/10/2021)   Humiliation, Afraid, Rape, and Kick questionnaire    Fear of Current or Ex-Partner: No    Emotionally Abused: No    Physically Abused: No    Sexually Abused:  No    Outpatient Medications Prior to Visit  Medication Sig Dispense Refill   acetaminophen (TYLENOL) 500 MG tablet Take 2 tablets (1,000 mg total) by mouth every 6 (six) hours as needed for moderate pain or mild pain. 60 tablet 0   ALPRAZolam (XANAX) 0.5 MG tablet TAKE 1/2 TO 1 TABLET BY MOUTH TWICE DAILY AS NEEDED FOR  ANXIETY 180 tablet 1   Biotin w/ Vitamins C & E (HAIR/SKIN/NAILS PO) Take 1 tablet by mouth daily.     Calcium-Vitamin D 500-3.125 MG-MCG TABS Take 1 tablet by mouth daily.     cetirizine (ZYRTEC) 10 MG tablet Take 10 mg by mouth in the morning.     cholecalciferol (VITAMIN D) 1000 units tablet Take 1,000 Units by mouth daily.     vitamin C (ASCORBIC ACID) 500 MG tablet Take 500 mg by mouth daily.     pantoprazole (PROTONIX) 40 MG tablet Take 1 tablet (40 mg total) by mouth daily. (Patient not taking: Reported on 02/22/2023) 90 tablet 3   No facility-administered medications prior to visit.    Allergies  Allergen Reactions   Latex Rash    Review of Systems  Constitutional:  Negative for appetite change, chills, fatigue and fever.  HENT:  Positive for rhinorrhea. Negative for congestion, dental problem, ear pain and sore throat.   Eyes:  Negative for discharge, redness and visual disturbance.  Respiratory:  Positive for cough. Negative for chest tightness, shortness of breath and wheezing.   Cardiovascular:  Negative for chest pain, palpitations and leg swelling.  Gastrointestinal:  Negative for abdominal pain, blood in stool, diarrhea, nausea and vomiting.  Genitourinary:  Negative for difficulty urinating, dysuria, flank pain, frequency, hematuria and urgency.  Musculoskeletal:  Negative for arthralgias, back pain, joint swelling, myalgias and neck stiffness.  Skin:  Negative for pallor and rash.  Neurological:  Negative for dizziness, speech difficulty, weakness and headaches.  Hematological:  Negative for adenopathy. Does not bruise/bleed easily.   Psychiatric/Behavioral:  Negative for confusion and sleep disturbance. The patient is not nervous/anxious.    PE;    02/22/2023   11:21 AM 08/25/2022    3:15 PM 08/25/2022    2:04 PM  Vitals with BMI  Height 5\' 4"   5\' 5"   Weight 123 lbs 13 oz  124 lbs 10 oz  BMI 21.24  20.73  Systolic 136 171 409  Diastolic 75 70 90  Pulse 79 62 74   Exam chaperoned by Cloe Motsinger, CMA  Gen: Alert, well appearing.  Patient is oriented to person, place, time, and situation. AFFECT: pleasant, lucid thought and speech. ENT: Ears: EACs clear, normal epithelium.  TMs with good light reflex and landmarks bilaterally.  Eyes: no injection, icteris, swelling, or exudate.  EOMI, PERRLA. Nose: no drainage or turbinate edema/swelling.  No injection or focal lesion.  Mouth: lips without lesion/swelling.  Oral mucosa pink and moist.  Dentition intact and without obvious caries or gingival swelling.  Oropharynx without erythema, exudate, or swelling.  Neck: supple/nontender.  No LAD, mass, or TM.  Carotid pulses 2+ bilaterally, without bruits. CV: RRR, no m/r/g.   LUNGS: CTA bilat, nonlabored resps, good aeration in all lung fields. ABD: soft, NT, ND, BS normal.  No hepatospenomegaly or mass.  No bruits. EXT: no clubbing, cyanosis, or edema.  Musculoskeletal: no joint swelling, erythema, warmth, or tenderness.  ROM of all joints intact. Skin - no sores or suspicious lesions or rashes or color changes  Pertinent labs:  Lab Results  Component Value Date   TSH 1.26 04/01/2020   Lab Results  Component Value Date   WBC 7.0 09/28/2021   HGB 13.8 09/28/2021   HCT 42.0 09/28/2021   MCV 95.0 09/28/2021   PLT 222 09/28/2021   Lab Results  Component Value Date   CREATININE 0.69 05/03/2021   BUN 12 05/03/2021   NA 137 05/03/2021  K 3.5 05/03/2021   CL 98 05/03/2021   CO2 33 (H) 05/03/2021   Lab Results  Component Value Date   ALT 18 04/01/2020   AST 20 04/01/2020   ALKPHOS 72 04/01/2020   BILITOT 0.4  04/01/2020   Lab Results  Component Value Date   CHOL 161 04/09/2021   Lab Results  Component Value Date   HDL 82.30 04/09/2021   Lab Results  Component Value Date   LDLCALC 60 04/09/2021   Lab Results  Component Value Date   TRIG 96.0 04/09/2021   Lab Results  Component Value Date   CHOLHDL 2 04/09/2021   ASSESSMENT AND PLAN:   #1 health maintenance exam: Reviewed age and gender appropriate health maintenance issues (prudent diet, regular exercise, health risks of tobacco and excessive alcohol, use of seatbelts, fire alarms in home, use of sunscreen).  Also reviewed age and gender appropriate health screening as well as vaccine recommendations. Vaccines: Flu vaccine today.  Otherwise ALL vaccines UTD. Labs: Lipid panel, CBC, c-Met.  She had an egg this morning. Cervical ca screening: via her GYN Breast ca screening: mammogram-> she will arrange this Colon ca screening: next colonoscopy 2029. Osteoporosis, + hx of hip fracture. She has received a bisphosphonate infusion via her orthopedist and she'll be arranging f/u with him soon (Dr. Eulah Pont).  #2 acute bronchitis, prolonged symptoms. This is complicated by chronic tobacco use. Will obtain chest x-ray. Treat with prednisone 40 mg a day x 5 days and then 20 mg a day x 5 days. Continue Mucinex DM.  #3 GAD with anxiety-related insomnia. Doing well on alprazolam 0.5 mg, 1/2-1 twice daily as needed.  An After Visit Summary was printed and given to the patient.  FOLLOW UP:  Return in about 6 months (around 08/22/2023) for routine chronic illness f/u.  Signed:  Santiago Bumpers, MD           02/22/2023

## 2023-05-24 DIAGNOSIS — D225 Melanocytic nevi of trunk: Secondary | ICD-10-CM | POA: Diagnosis not present

## 2023-05-24 DIAGNOSIS — D2371 Other benign neoplasm of skin of right lower limb, including hip: Secondary | ICD-10-CM | POA: Diagnosis not present

## 2023-05-24 DIAGNOSIS — D224 Melanocytic nevi of scalp and neck: Secondary | ICD-10-CM | POA: Diagnosis not present

## 2023-05-24 DIAGNOSIS — Z85820 Personal history of malignant melanoma of skin: Secondary | ICD-10-CM | POA: Diagnosis not present

## 2023-05-24 DIAGNOSIS — L821 Other seborrheic keratosis: Secondary | ICD-10-CM | POA: Diagnosis not present

## 2023-05-24 DIAGNOSIS — D3614 Benign neoplasm of peripheral nerves and autonomic nervous system of thorax: Secondary | ICD-10-CM | POA: Diagnosis not present

## 2023-05-24 DIAGNOSIS — D2261 Melanocytic nevi of right upper limb, including shoulder: Secondary | ICD-10-CM | POA: Diagnosis not present

## 2023-05-24 DIAGNOSIS — D485 Neoplasm of uncertain behavior of skin: Secondary | ICD-10-CM | POA: Diagnosis not present

## 2023-05-24 DIAGNOSIS — L578 Other skin changes due to chronic exposure to nonionizing radiation: Secondary | ICD-10-CM | POA: Diagnosis not present

## 2023-07-27 DIAGNOSIS — R5383 Other fatigue: Secondary | ICD-10-CM | POA: Diagnosis not present

## 2023-07-27 DIAGNOSIS — E559 Vitamin D deficiency, unspecified: Secondary | ICD-10-CM | POA: Diagnosis not present

## 2023-07-27 DIAGNOSIS — M810 Age-related osteoporosis without current pathological fracture: Secondary | ICD-10-CM | POA: Diagnosis not present

## 2023-07-31 ENCOUNTER — Telehealth: Payer: Self-pay

## 2023-07-31 NOTE — Telephone Encounter (Signed)
 Auth Submission: NO AUTH NEEDED Site of care: Site of care: [facility] Payer: [employer] medicare Medication & CPT/J Code(s) submitted: Reclast  (Zolendronic acid) [id number] Diagnosis Code:  Route of submission (phone, fax, portal): portal Phone # Fax # Auth type: Buy/Bill PB Units/visits requested: 5mg  x 1 dose Reference number:  Approval from: 07/31/23 to 01/31/24

## 2023-08-09 ENCOUNTER — Encounter: Payer: Self-pay | Admitting: Sports Medicine

## 2023-08-16 ENCOUNTER — Ambulatory Visit

## 2023-08-16 MED ORDER — ZOLEDRONIC ACID 5 MG/100ML IV SOLN
5.0000 mg | Freq: Once | INTRAVENOUS | Status: DC
Start: 2023-08-16 — End: 2023-08-16

## 2023-08-16 MED ORDER — DIPHENHYDRAMINE HCL 25 MG PO CAPS
25.0000 mg | ORAL_CAPSULE | Freq: Once | ORAL | Status: DC
Start: 2023-08-16 — End: 2023-08-16

## 2023-08-16 MED ORDER — SODIUM CHLORIDE 0.9 % IV SOLN: INTRAVENOUS | Status: AC

## 2023-08-16 MED ORDER — ACETAMINOPHEN 325 MG PO TABS
650.0000 mg | ORAL_TABLET | Freq: Once | ORAL | Status: DC
Start: 2023-08-16 — End: 2023-08-16

## 2023-08-23 ENCOUNTER — Ambulatory Visit: Payer: Medicare PPO | Admitting: Family Medicine

## 2023-08-23 ENCOUNTER — Encounter: Payer: Self-pay | Admitting: Family Medicine

## 2023-08-23 VITALS — BP 122/68 | HR 71 | Temp 98.6°F | Ht 64.0 in | Wt 122.8 lb

## 2023-08-23 DIAGNOSIS — F5104 Psychophysiologic insomnia: Secondary | ICD-10-CM

## 2023-08-23 DIAGNOSIS — F411 Generalized anxiety disorder: Secondary | ICD-10-CM

## 2023-08-23 DIAGNOSIS — Z1231 Encounter for screening mammogram for malignant neoplasm of breast: Secondary | ICD-10-CM | POA: Diagnosis not present

## 2023-08-23 DIAGNOSIS — Z79899 Other long term (current) drug therapy: Secondary | ICD-10-CM

## 2023-08-23 MED ORDER — ALPRAZOLAM 0.5 MG PO TABS: ORAL_TABLET | ORAL | 1 refills | Status: DC

## 2023-08-23 NOTE — Progress Notes (Signed)
 OFFICE VISIT  08/23/2023  CC:  Chief Complaint  Patient presents with   Medical Management of Chronic Issues    Pt is not fasting; repeat bone density in October 2025    Patient is a 73 y.o. female who presents for 33-month follow-up GAD, high risk medication use. All was stable with her anxiety and insomnia at the time of the last visit.  We continued her on 0.5 mg alprazolam , 1/2-1 tab twice daily as needed. Additionally, all of her lab tests were normal.  INTERIM HX: Linda Davies is feeling well. Spending lots of time with her grandchildren, [place].  Alprazolam  twice daily continues to be adequately helpful for her daily anxiety and her anxiety-induced insomnia. PMP AWARE reviewed today: most recent rx for alprazolam  was filled 07/29/2023, # 60, rx by me. No red flags.   Past Medical History:  Diagnosis Date   Allergic rhinitis    Anxiety    Arthritis    low back and both hands   Basal cell carcinoma    forehead - removed in office   COPD (chronic obstructive pulmonary disease) (HCC)    asymptomatic BUT has purse-lipped breathing on exam even when feeling well   COVID 2021   on prednisone   and antibiotics   Gastritis 08/17/2017   EGD   GERD (gastroesophageal reflux disease)    Iron deficiency anemia 05/2017     Hemoccults neg 05/26/17.  Colonoscopy -->no culprit.  EGD--> gastritis (h pyl NEG).   Melanoma (HCC)    Near umbilicus. close f/u with Dr. Robinson (annual)   Osteoporosis    + hip fracture 2023   Pneumonia    x 1   S/P right hip fracture 10/2020   Tobacco dependence     Past Surgical History:  Procedure Laterality Date   CESAREAN SECTION  1981   spinal   COLONOSCOPY  08/17/2017   Done for unexplained IDA-->Diverticulosis, non-bleeding internal hem, o/w normal.  Repeat 10 yrs.   ESOPHAGOGASTRODUODENOSCOPY  08/17/2017   +Gastritis.  Gastric bx reactive gastritis.  Bx for H pylori NEG.        Duodenum bx: NORMAL.   EYE SURGERY  Left    lasik   HIP PINNING,CANNULATED Right 11/26/2020   Procedure: PERCUTANEOUS PINNING EXTREMITYproximal femur;  Surgeon: Josefina Chew, MD;  Location: [facility];  Service: Orthopedics;  Laterality: Right;   MELANOMA EXCISION     abd midline below belly button   TOTAL HIP ARTHROPLASTY Right 05/04/2021   Procedure: TOTAL ANTERIOR APPROACH HIP ARTHROPLASTY WITH SCREW REMOVAL;  Surgeon: Beverley Evalene BIRCH, MD;  Location: [facility];  Service: Orthopedics;  Laterality: Right;   TOTAL HIP ARTHROPLASTY Left 09/28/2021   Procedure: TOTAL HIP ARTHROPLASTY ANTERIOR APPROACH;  Surgeon: Beverley Evalene BIRCH, MD;  Location: [facility];  Service: Orthopedics;  Laterality: Left;    Outpatient Medications Prior to Visit  Medication Sig Dispense Refill   acetaminophen  (TYLENOL ) 500 MG tablet Take 2 tablets (1,000 mg total) by mouth every 6 (six) hours as needed for moderate pain or mild pain. 60 tablet 0   ALPRAZolam  (XANAX ) 0.5 MG tablet TAKE 1/2 TO 1 TABLET BY MOUTH TWICE DAILY AS NEEDED FOR ANXIETY 180 tablet 1   Biotin w/ Vitamins C & E (HAIR/SKIN/NAILS PO) Take 1 tablet by mouth daily.     Calcium -Vitamin D  500-3.125 MG-MCG TABS Take 1 tablet by mouth daily.     cetirizine (ZYRTEC) 10 MG tablet Take 10 mg by mouth in the  morning.     cholecalciferol (VITAMIN D ) 1000 units tablet Take 1,000 Units by mouth daily.     vitamin C (ASCORBIC ACID) 500 MG tablet Take 500 mg by mouth daily.     pantoprazole  (PROTONIX ) 40 MG tablet Take 1 tablet (40 mg total) by mouth daily. (Patient not taking: Reported on 08/23/2023) 90 tablet 3   predniSONE  (DELTASONE ) 20 MG tablet 2 tabs daily x 5 days then 1 tab daily x 5 days 15 tablet 0   Facility-Administered Medications Prior to Visit  Medication Dose Route Frequency Provider Last Rate Last Admin   0.9 %  sodium chloride  infusion   Intravenous Continuous Orpha Asberry RAMAN, MD        Allergies  Allergen Reactions   Latex Rash    Review of Systems As per HPI  PE:     08/23/2023   10:41 AM 02/22/2023   11:21 AM 08/25/2022    3:15 PM  Vitals with BMI  Height 5' 4 5' 4   Weight 122 lbs 13 oz 123 lbs 13 oz   BMI 21.07 21.24   Systolic 145 136 828  Diastolic 79 75 70  Pulse 71 79 62     Physical Exam  Gen: Alert, well appearing.  Patient is oriented to person, place, time, and situation. AFFECT: pleasant, lucid thought and speech. No further exam today  LABS:  Last CBC Lab Results  Component Value Date   WBC 7.3 02/22/2023   HGB 14.8 02/22/2023   HCT 45.3 02/22/2023   MCV 96.2 02/22/2023   MCH 31.2 09/28/2021   RDW 12.8 02/22/2023   PLT 262.0 02/22/2023   Last metabolic panel Lab Results  Component Value Date   GLUCOSE 86 02/22/2023   NA 142 02/22/2023   K 4.8 02/22/2023   CL 102 02/22/2023   CO2 33 (H) 02/22/2023   BUN 15 02/22/2023   CREATININE 0.67 02/22/2023   GFR 88.78 02/22/2023   CALCIUM  9.9 02/22/2023   PROT 7.0 02/22/2023   ALBUMIN 4.8 02/22/2023   BILITOT 0.5 02/22/2023   ALKPHOS 46 02/22/2023   AST 17 02/22/2023   ALT 14 02/22/2023   ANIONGAP 6 05/03/2021   Last lipids Lab Results  Component Value Date   CHOL 188 02/22/2023   HDL 89.40 02/22/2023   LDLCALC 77 02/22/2023   TRIG 111.0 02/22/2023   CHOLHDL 2 02/22/2023   Last thyroid  functions Lab Results  Component Value Date   TSH 1.26 04/01/2020   Last vitamin D  Lab Results  Component Value Date   VD25OH 74.99 04/09/2021   Last vitamin B12 and Folate Lab Results  Component Value Date   VITAMINB12 >1500 (H) 05/11/2017   FOLATE >24.1 05/11/2017    IMPRESSION AND PLAN:  #1 GAD with anxiety-induced insomnia. Stable on alprazolam  0.5 mg, 1/2-1 twice daily as needed, #180, refill x 1.  #2 preventative health care: Mammogram ordered today.   An After Visit Summary was printed and given to the patient.  FOLLOW UP: No follow-ups on file.  Signed:  Gerlene Hockey, MD           08/23/2023

## 2023-08-30 ENCOUNTER — Ambulatory Visit (INDEPENDENT_AMBULATORY_CARE_PROVIDER_SITE_OTHER)

## 2023-08-30 VITALS — BP 154/73 | HR 55 | Temp 97.3°F | Resp 18 | Ht 64.0 in | Wt 122.6 lb

## 2023-08-30 DIAGNOSIS — M810 Age-related osteoporosis without current pathological fracture: Secondary | ICD-10-CM | POA: Diagnosis not present

## 2023-08-30 MED ORDER — ZOLEDRONIC ACID 5 MG/100ML IV SOLN
5.0000 mg | Freq: Once | INTRAVENOUS | Status: AC
  Administered 2023-08-30: 5 mg via INTRAVENOUS
  Filled 2023-08-30: qty 100

## 2023-08-30 MED ORDER — DIPHENHYDRAMINE HCL 25 MG PO CAPS
25.0000 mg | ORAL_CAPSULE | Freq: Once | ORAL | Status: AC
  Administered 2023-08-30: 25 mg via ORAL
  Filled 2023-08-30: qty 1

## 2023-08-30 MED ORDER — ACETAMINOPHEN 325 MG PO TABS
650.0000 mg | ORAL_TABLET | Freq: Once | ORAL | Status: AC
  Administered 2023-08-30: 650 mg via ORAL
  Filled 2023-08-30: qty 2

## 2023-08-30 NOTE — Progress Notes (Signed)
 Diagnosis: Osteoporosis  Provider:  Lonna Coder MD  Procedure: IV Infusion  IV Type: Peripheral, IV Location: R Antecubital  Reclast  (Zolendronic Acid), Dose: 5 mg  Infusion Start Time: 1107  Infusion Stop Time: 1137  Post Infusion IV Care: Patient declined observation and PIV Discontinued  Discharge: Condition: Good, Destination: Home . AVS Declined  Performed by:  Eleanor DELENA Bloch, RN

## 2023-09-22 ENCOUNTER — Ambulatory Visit
Admission: RE | Admit: 2023-09-22 | Discharge: 2023-09-22 | Disposition: A | Discharge status: Home / Self Care | Source: Ambulatory Visit | Attending: Family Medicine | Admitting: Family Medicine

## 2023-09-22 DIAGNOSIS — Z1231 Encounter for screening mammogram for malignant neoplasm of breast: Secondary | ICD-10-CM | POA: Diagnosis not present

## 2023-10-18 ENCOUNTER — Ambulatory Visit

## 2023-11-20 NOTE — Telephone Encounter (Signed)
 Pt completed 09/22/23

## 2023-11-27 DIAGNOSIS — M810 Age-related osteoporosis without current pathological fracture: Secondary | ICD-10-CM | POA: Diagnosis not present

## 2023-11-27 LAB — HM DEXA SCAN

## 2023-12-04 DIAGNOSIS — Z96642 Presence of left artificial hip joint: Secondary | ICD-10-CM | POA: Diagnosis not present

## 2023-12-04 DIAGNOSIS — Z471 Aftercare following joint replacement surgery: Secondary | ICD-10-CM | POA: Diagnosis not present

## 2023-12-06 ENCOUNTER — Ambulatory Visit

## 2023-12-06 ENCOUNTER — Telehealth: Payer: Self-pay

## 2023-12-06 VITALS — Ht 64.0 in | Wt 122.0 lb

## 2023-12-06 DIAGNOSIS — Z0000 Encounter for general adult medical examination without abnormal findings: Secondary | ICD-10-CM | POA: Diagnosis not present

## 2023-12-06 NOTE — Telephone Encounter (Signed)
 noted

## 2023-12-06 NOTE — Patient Instructions (Signed)
 Ms. Hitchman,  Thank you for taking the time for your Medicare Wellness Visit. I appreciate your continued commitment to your health goals. Please review the care plan we discussed, and feel free to reach out if I can assist you further.  Please note that Annual Wellness Visits do not include a physical exam. Some assessments may be limited, especially if the visit was conducted virtually. If needed, we may recommend an in-person follow-up with your provider.  Ongoing Care Seeing your primary care provider every 3 to 6 months helps us  monitor your health and provide consistent, personalized care.   Referrals If a referral was made during today's visit and you haven't received any updates within two weeks, please contact the referred provider directly to check on the status.  Recommended Screenings:  Health Maintenance  Topic Date Due   COVID-19 Vaccine (3 - Moderna risk series) 06/07/2019   Flu Shot  04/30/2024*   Breast Cancer Screening  09/21/2024   Medicare Annual Wellness Visit  12/05/2024   DTaP/Tdap/Td vaccine (2 - Td or Tdap) 05/25/2027   Colon Cancer Screening  08/18/2027   Pneumococcal Vaccine for age over 46  Completed   DEXA scan (bone density measurement)  Completed   Hepatitis C Screening  Completed   Zoster (Shingles) Vaccine  Completed   Meningitis B Vaccine  Aged Out  *Topic was postponed. The date shown is not the original due date.       12/06/2023   12:00 PM  Advanced Directives  Does Patient Have a Medical Advance Directive? No  Would patient like information on creating a medical advance directive? No - Patient declined    Vision: Annual vision screenings are recommended for early detection of glaucoma, cataracts, and diabetic retinopathy. These exams can also reveal signs of chronic conditions such as diabetes and high blood pressure.  Dental: Annual dental screenings help detect early signs of oral cancer, gum disease, and other conditions linked to overall  health, including heart disease and diabetes.

## 2023-12-06 NOTE — Progress Notes (Signed)
 Subjective:   Agata Lucente is a 73 y.o. female who presents for a Medicare Annual Wellness Visit.  Visit Complete: Virtual I connected with on by an audio enabled telemedicine application and verified that I am speaking with the correct person using two identifiers.   Patient Location: Home   Provider Location: Office/Clinic   I discussed the limitations of evaluation and management by telemedicine. The patient expressed understanding and agreed to proceed.   Vital Signs: Because this visit was a virtual/telehealth visit, some criteria may be missing or patient reported. Any vitals not documented were not able to be obtained and vitals that have been documented are patient reported.   Video Declined- This patient declined Interactive audio and video telecommunications. Therefore, the visit was completed with audio only.   Persons Participating in Visit: Patient.  Allergies (verified) Latex   History: Past Medical History:  Diagnosis Date   Allergic rhinitis    Anxiety    Arthritis    low back and both hands   Basal cell carcinoma    forehead - removed in office   COPD (chronic obstructive pulmonary disease) (HCC)    asymptomatic BUT has purse-lipped breathing on exam even when feeling well   Gastritis 08/17/2017   EGD   GERD (gastroesophageal reflux disease)    Iron deficiency anemia 05/2017     Hemoccults neg 05/26/17.  Colonoscopy -->no culprit.  EGD--> gastritis (h pyl NEG).   Melanoma (HCC)    Near umbilicus. close f/u with Dr. Robinson (annual)   Osteoporosis    + hip fracture 2023.  Annual bisphosphonate infusion per orthopedist   Pneumonia    x 1   S/P right hip fracture 10/2020   Tobacco dependence    Past Surgical History:  Procedure Laterality Date   CESAREAN SECTION  1981   spinal   COLONOSCOPY  08/17/2017   Done for unexplained IDA-->Diverticulosis, non-bleeding internal hem, o/w normal.  Repeat 10 yrs.   ESOPHAGOGASTRODUODENOSCOPY  08/17/2017    +Gastritis.  Gastric bx reactive gastritis.  Bx for H pylori NEG.        Duodenum bx: NORMAL.   EYE SURGERY Left    lasik   HIP PINNING,CANNULATED Right 11/26/2020   Procedure: PERCUTANEOUS PINNING EXTREMITYproximal femur;  Surgeon: Josefina Chew, MD;  Location: [facility];  Service: Orthopedics;  Laterality: Right;   MELANOMA EXCISION     abd midline below belly button   TOTAL HIP ARTHROPLASTY Right 05/04/2021   Procedure: TOTAL ANTERIOR APPROACH HIP ARTHROPLASTY WITH SCREW REMOVAL;  Surgeon: Beverley Evalene BIRCH, MD;  Location: [facility];  Service: Orthopedics;  Laterality: Right;   TOTAL HIP ARTHROPLASTY Left 09/28/2021   Procedure: TOTAL HIP ARTHROPLASTY ANTERIOR APPROACH;  Surgeon: Beverley Evalene BIRCH, MD;  Location: [facility];  Service: Orthopedics;  Laterality: Left;   Family History  Problem Relation Age of Onset   Arthritis Mother    Asthma Mother    COPD Mother    Hypertension Mother    Cancer Father        liver ca   Alcohol abuse Father    Alzheimer's disease Father    Cancer Brother        Leukemia 2017   Hearing loss Brother    Early death Brother    Alcohol abuse Maternal Grandmother    Cancer Maternal Grandmother    Cancer Maternal Grandfather        lung   Alzheimer's disease Paternal Grandmother    Cancer Paternal Grandfather  lung   Asthma Daughter    Social History   Occupational History   Occupation: Radio broadcast assistant  Tobacco Use   Smoking status: Every Day    Current packs/day: 0.50    Average packs/day: 0.5 packs/day for 30.0 years (15.0 ttl pk-yrs)    Types: Cigarettes   Smokeless tobacco: Never  Vaping Use   Vaping status: Former  Substance and Sexual Activity   Alcohol use: Yes    Alcohol/week: 1.0 standard drink of alcohol    Types: 1 Glasses of wine per week    Comment: occasional wine   Drug use: Never   Sexual activity: Yes    Partners: Male    Birth control/protection: Post-menopausal   Tobacco Counseling Ready to quit: Not  Answered Counseling given: Not Answered  SDOH Screenings   Food Insecurity: No Food Insecurity (12/06/2023)  Housing: Unknown (12/06/2023)  Transportation Needs: No Transportation Needs (12/06/2023)  Utilities: Not At Risk (12/06/2023)  Alcohol Screen: Low Risk  (04/05/2021)  Depression (PHQ2-9): Low Risk  (12/06/2023)  Financial Resource Strain: Low Risk  (04/10/2021)  Physical Activity: Inactive (12/06/2023)  Social Connections: Moderately Isolated (12/06/2023)  Stress: No Stress Concern Present (12/06/2023)  Tobacco Use: High Risk (12/06/2023)  Health Literacy: Adequate Health Literacy (12/06/2023)   Depression Screen    12/06/2023   12:00 PM 08/23/2023   10:42 AM 08/15/2022    2:48 PM 12/06/2021    2:43 PM 04/10/2021   10:07 AM 11/03/2020   11:27 AM 04/01/2020    1:46 PM  PHQ 2/9 Scores  PHQ - 2 Score 0 0 0 0 0 1 0  PHQ- 9 Score 0  0    0     Goals Addressed               This Visit's Progress     Patient Stated (pt-stated)        Patient stated she plans to continue walking and want to gain weight       Visit info / Clinical Intake: Medicare Wellness Visit Type:: Subsequent Annual Wellness Visit Medicare Wellness Visit Mode:: Telephone If telephone:: video declined If telephone or video:: vitals recorded from last visit Interpreter Needed?: No Pre-visit prep was completed: yes AWV questionnaire completed by patient prior to visit?: no Living arrangements:: lives with spouse/significant other Patient's Overall Health Status Rating: good Typical amount of pain: none Does pain affect daily life?: no Are you currently prescribed opioids?: no  Dietary Habits and Nutritional Risks How many meals a day?: 3 Eats fruit and vegetables daily?: yes Most meals are obtained by: preparing own meals; eating out In the last 2 weeks, have you had any of the following?: -- (none) Diabetic:: no  Functional Status Activities of Daily Living (to include ambulation/medication):  Independent Ambulation: Independent with device- listed below Home Assistive Devices/Equipment: Eyeglasses Medication Administration: Independent Home Management: Independent Manage your own finances?: yes Primary transportation is: driving Concerns about vision?: no *vision screening is required for WTM* Concerns about hearing?: no  Fall Screening Falls in the past year?: 0 Number of falls in past year: 0 Was there an injury with Fall?: 0 Fall Risk Category Calculator: 0 Patient Fall Risk Level: Low Fall Risk  Fall Risk Patient at Risk for Falls Due to: No Fall Risks Fall risk Follow up: Falls evaluation completed  Home and Transportation Safety: All rugs have non-skid backing?: yes All stairs or steps have railings?: yes Grab bars in the bathtub or shower?: yes Have non-skid surface in  bathtub or shower?: yes Good home lighting?: yes Regular seat belt use?: yes Hospital stays in the last year:: no  Cognitive Assessment Difficulty concentrating, remembering, or making decisions? : no Will 6CIT or Mini Cog be Completed: yes What year is it?: 0 points What month is it?: 0 points Give patient an address phrase to remember (5 components): [address] [place], [place] About what time is it?: 0 points Count backwards from 20 to 1: 0 points Say the months of the year in reverse: 0 points Repeat the address phrase from earlier: 0 points 6 CIT Score: 0 points  Advance Directives (For Healthcare) Does Patient Have a Medical Advance Directive?: No Would patient like information on creating a medical advance directive?: No - Patient declined  Reviewed/Updated  Reviewed/Updated: All        Objective:    Today's Vitals   12/06/23 1242  Weight: 122 lb (55.3 kg)  Height: 5' 4 (1.626 m)   Body mass index is 20.94 kg/m.  Current Medications (verified) Outpatient Encounter Medications as of 12/06/2023  Medication Sig   acetaminophen  (TYLENOL ) 500 MG tablet Take 2 tablets  (1,000 mg total) by mouth every 6 (six) hours as needed for moderate pain or mild pain.   ALPRAZolam  (XANAX ) 0.5 MG tablet TAKE 1/2 TO 1 TABLET BY MOUTH TWICE DAILY AS NEEDED FOR ANXIETY   Biotin w/ Vitamins C & E (HAIR/SKIN/NAILS PO) Take 1 tablet by mouth daily.   Calcium -Vitamin D  500-3.125 MG-MCG TABS Take 1 tablet by mouth daily.   cetirizine (ZYRTEC) 10 MG tablet Take 10 mg by mouth in the morning.   cholecalciferol (VITAMIN D ) 1000 units tablet Take 1,000 Units by mouth daily.   vitamin C (ASCORBIC ACID) 500 MG tablet Take 500 mg by mouth daily.   Facility-Administered Encounter Medications as of 12/06/2023  Medication   0.9 %  sodium chloride  infusion   Hearing/Vision screen Hearing Screening - Comments:: Denies hearing difficulties   Vision Screening - Comments:: Wears rx glasses - up to date with routine eye exams  Immunizations and Health Maintenance Health Maintenance  Topic Date Due   COVID-19 Vaccine (3 - Moderna risk series) 06/07/2019   Influenza Vaccine  04/30/2024 (Originally 09/01/2023)   Mammogram  09/21/2024   Medicare Annual Wellness (AWV)  12/05/2024   DTaP/Tdap/Td (2 - Td or Tdap) 05/25/2027   Colonoscopy  08/18/2027   Pneumococcal Vaccine: 50+ Years  Completed   DEXA SCAN  Completed   Hepatitis C Screening  Completed   Zoster Vaccines- Shingrix   Completed   Meningococcal B Vaccine  Aged Out        Assessment/Plan:  This is a routine wellness examination for Maleny.  Patient Care Team: Candise Aleene DEL, MD as PCP - General (Family Medicine) Robinson Pao, MD as Consulting Physician (Dermatology) Shila Gustav GAILS, MD as Consulting Physician (Gastroenterology) Josefina Chew, MD as Consulting Physician (Orthopedic Surgery) [facility] (Orthopedic Surgery)  I have personally reviewed and noted the following in the patient's chart:   Medical and social history Use of alcohol, tobacco or illicit drugs  Current medications and  supplements including opioid prescriptions. Functional ability and status Nutritional status Physical activity Advanced directives List of other physicians Hospitalizations, surgeries, and ER visits in previous 12 months Vitals Screenings to include cognitive, depression, and falls Referrals and appointments  No orders of the defined types were placed in this encounter.  In addition, I have reviewed and discussed with patient certain preventive protocols, quality metrics,  and best practice recommendations. A written personalized care plan for preventive services as well as general preventive health recommendations were provided to patient.   Verdie CHRISTELLA Saba, CMA   12/06/2023   Return in 1 year (on 12/05/2024).  After Visit Summary: (MyChart) Due to this being a telephonic visit, the after visit summary with patients personalized plan was offered to patient via MyChart   Nurse Notes: Scheduled a 6-mth f/u appt w/PCP for 02/2024.

## 2023-12-06 NOTE — Telephone Encounter (Signed)
 Copied from CRM [id number]. Topic: Appointments - Scheduling Inquiry for Clinic >> Dec 06, 2023 11:35 AM Laymon HERO wrote: Reason for CRM: Patient has been waiting since 10:30 for her AWV phone call scheduled at 10:50 and no one has called her yet, Please reach out to patient to reschedule is needed

## 2024-02-16 ENCOUNTER — Other Ambulatory Visit: Payer: Self-pay | Admitting: Family Medicine

## 2024-02-26 ENCOUNTER — Ambulatory Visit: Admitting: Family Medicine

## 2024-03-06 ENCOUNTER — Encounter: Payer: Self-pay | Admitting: Family Medicine

## 2024-03-07 ENCOUNTER — Ambulatory Visit: Admitting: Family Medicine

## 2024-03-07 ENCOUNTER — Encounter: Payer: Self-pay | Admitting: Family Medicine

## 2024-03-07 VITALS — BP 124/70 | HR 93 | Temp 98.3°F | Ht 64.0 in | Wt 120.0 lb

## 2024-03-07 DIAGNOSIS — Z0000 Encounter for general adult medical examination without abnormal findings: Secondary | ICD-10-CM

## 2024-03-07 DIAGNOSIS — M810 Age-related osteoporosis without current pathological fracture: Secondary | ICD-10-CM

## 2024-03-07 DIAGNOSIS — F411 Generalized anxiety disorder: Secondary | ICD-10-CM

## 2024-03-07 DIAGNOSIS — Z79899 Other long term (current) drug therapy: Secondary | ICD-10-CM

## 2024-03-07 LAB — COMPREHENSIVE METABOLIC PANEL WITH GFR
ALT: 14 U/L (ref 3–35)
AST: 17 U/L (ref 5–37)
Albumin: 4.7 g/dL (ref 3.5–5.2)
Alkaline Phosphatase: 50 U/L (ref 39–117)
BUN: 19 mg/dL (ref 6–23)
CO2: 35 meq/L — ABNORMAL HIGH (ref 19–32)
Calcium: 10.3 mg/dL (ref 8.4–10.5)
Chloride: 102 meq/L (ref 96–112)
Creatinine, Ser: 0.68 mg/dL (ref 0.40–1.20)
GFR: 87.82 mL/min
Glucose, Bld: 133 mg/dL — ABNORMAL HIGH (ref 70–99)
Potassium: 4.1 meq/L (ref 3.5–5.1)
Sodium: 143 meq/L (ref 135–145)
Total Bilirubin: 0.5 mg/dL (ref 0.2–1.2)
Total Protein: 6.7 g/dL (ref 6.0–8.3)

## 2024-03-07 LAB — CBC WITH DIFFERENTIAL/PLATELET
Basophils Absolute: 0 10*3/uL (ref 0.0–0.1)
Basophils Relative: 0.5 % (ref 0.0–3.0)
Eosinophils Absolute: 0.1 10*3/uL (ref 0.0–0.7)
Eosinophils Relative: 1.4 % (ref 0.0–5.0)
HCT: 44 % (ref 36.0–46.0)
Hemoglobin: 14.8 g/dL (ref 12.0–15.0)
Lymphocytes Relative: 28.1 % (ref 12.0–46.0)
Lymphs Abs: 1.9 10*3/uL (ref 0.7–4.0)
MCHC: 33.6 g/dL (ref 30.0–36.0)
MCV: 94.1 fl (ref 78.0–100.0)
Monocytes Absolute: 0.5 10*3/uL (ref 0.1–1.0)
Monocytes Relative: 6.9 % (ref 3.0–12.0)
Neutro Abs: 4.3 10*3/uL (ref 1.4–7.7)
Neutrophils Relative %: 63.1 % (ref 43.0–77.0)
Platelets: 207 10*3/uL (ref 150.0–400.0)
RBC: 4.68 Mil/uL (ref 3.87–5.11)
RDW: 12.9 % (ref 11.5–15.5)
WBC: 6.9 10*3/uL (ref 4.0–10.5)

## 2024-03-07 LAB — VITAMIN D 25 HYDROXY (VIT D DEFICIENCY, FRACTURES): VITD: 52.61 ng/mL (ref 30.00–100.00)

## 2024-03-07 NOTE — Progress Notes (Signed)
 "     Office Note 03/07/2024  CC:  Chief Complaint  Patient presents with   Medical Management of Chronic Issues    Pt is not fasting   HPI:  Patient is a 74 y.o. female who is here for annual health maintenance exam and follow-up GAD, high risk medication use.  Linda Davies is feeling well. No acute concerns. She will be going on a cruise when the weather gets warmer.  Anxiety well-controlled, sleep is good. PMP AWARE reviewed today: most recent rx for alprazolam  0.5 mg was filled 02/16/2024, # 60, rx by me. No red flags.   Past Medical History:  Diagnosis Date   Allergic rhinitis    Anxiety    Arthritis    low back and both hands   Basal cell carcinoma    forehead - removed in office   COPD (chronic obstructive pulmonary disease) (HCC)    asymptomatic BUT has purse-lipped breathing on exam even when feeling well   Gastritis 08/17/2017   EGD   GERD (gastroesophageal reflux disease)    Iron deficiency anemia 05/2017     Hemoccults neg 05/26/17.  Colonoscopy -->no culprit.  EGD--> gastritis (h pyl NEG).   Melanoma (HCC)    Near umbilicus. close f/u with Dr. Robinson (annual)   Osteoporosis    + hip fracture 2023.  Annual bisphosphonate infusion per orthopedist   Pneumonia    x 1   S/P right hip fracture 10/2020   Tobacco dependence     Past Surgical History:  Procedure Laterality Date   bone densitometry     11/27/23 T score -2.6   CESAREAN SECTION  1981   spinal   COLONOSCOPY  08/17/2017   Done for unexplained IDA-->Diverticulosis, non-bleeding internal hem, o/w normal.  Repeat 10 yrs.   ESOPHAGOGASTRODUODENOSCOPY  08/17/2017   +Gastritis.  Gastric bx reactive gastritis.  Bx for H pylori NEG.        Duodenum bx: NORMAL.   EYE SURGERY Left    lasik   HIP PINNING,CANNULATED Right 11/26/2020   Procedure: PERCUTANEOUS PINNING EXTREMITYproximal femur;  Surgeon: Josefina Chew, MD;  Location: [facility];  Service: Orthopedics;  Laterality: Right;   MELANOMA EXCISION     abd  midline below belly button   TOTAL HIP ARTHROPLASTY Right 05/04/2021   Procedure: TOTAL ANTERIOR APPROACH HIP ARTHROPLASTY WITH SCREW REMOVAL;  Surgeon: Beverley Evalene BIRCH, MD;  Location: [facility];  Service: Orthopedics;  Laterality: Right;   TOTAL HIP ARTHROPLASTY Left 09/28/2021   Procedure: TOTAL HIP ARTHROPLASTY ANTERIOR APPROACH;  Surgeon: Beverley Evalene BIRCH, MD;  Location: [facility];  Service: Orthopedics;  Laterality: Left;    Family History  Problem Relation Age of Onset   Arthritis Mother    Asthma Mother    COPD Mother    Hypertension Mother    Cancer Father        liver ca   Alcohol abuse Father    Alzheimer's disease Father    Cancer Brother        Leukemia 2017   Hearing loss Brother    Early death Brother    Alcohol abuse Maternal Grandmother    Cancer Maternal Grandmother    Cancer Maternal Grandfather        lung   Alzheimer's disease Paternal Grandmother    Cancer Paternal Grandfather        lung   Asthma Daughter     Social History   Socioeconomic History   Marital status: Married  Spouse name: Not on file   Number of children: Not on file   Years of education: Not on file   Highest education level: Bachelor's degree (e.g., BA, AB, BS)  Occupational History   Occupation: Radio broadcast assistant  Tobacco Use   Smoking status: Every Day    Current packs/day: 0.50    Average packs/day: 0.5 packs/day for 30.0 years (15.0 ttl pk-yrs)    Types: Cigarettes   Smokeless tobacco: Never  Vaping Use   Vaping status: Former  Substance and Sexual Activity   Alcohol use: Yes    Alcohol/week: 1.0 standard drink of alcohol    Types: 1 Glasses of wine per week    Comment: occasional wine   Drug use: Never   Sexual activity: Yes    Partners: Male    Birth control/protection: Post-menopausal  Other Topics Concern   Not on file  Social History Narrative   Married (her pt is a husband of mine as well), 3 children, 1 grand child.   Educ: college.   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   Tob: current as of 05/2017; 15 pack-yr hx.   Jor:[email or web address]   Social Drivers of Health   Tobacco Use: High Risk (03/07/2024)   Patient History    Smoking Tobacco Use: Every Day    Smokeless Tobacco Use: Never    Passive Exposure: Not on file  Financial Resource Strain: Low Risk (04/10/2021)   Overall Financial Resource Strain (CARDIA)    Difficulty of Paying Living Expenses: Not hard at all  Food Insecurity: No Food Insecurity (12/06/2023)   Epic    Worried About Programme Researcher, Broadcasting/film/video in the Last Year: Never true    Ran Out of Food in the Last Year: Never true  Transportation Needs: No Transportation Needs (12/06/2023)   Epic    Lack of Transportation (Medical): No    Lack of Transportation (Non-Medical): No  Physical Activity: Inactive (12/06/2023)   Exercise Vital Sign    Days of Exercise per Week: 0 days    Minutes of Exercise per Session: 0 min  Stress: No Stress Concern Present (12/06/2023)   [employer] of Occupational Health - Occupational Stress Questionnaire    Feeling of Stress: Not at all  Social Connections: Moderately Isolated (12/06/2023)   Social Connection and Isolation Panel    Frequency of Communication with Friends and Family: More than three times a week    Frequency of Social Gatherings with Friends and Family: Twice a week    Attends Religious Services: Never    Database Administrator or Organizations: No    Attends Banker Meetings: Never    Marital Status: Married  Catering Manager Violence: Not At Risk (12/06/2023)   Epic    Fear of Current or Ex-Partner: No    Emotionally Abused: No    Physically Abused: No    Sexually Abused: No  Depression (PHQ2-9): Low Risk (03/07/2024)   Depression (PHQ2-9)    PHQ-2 Score: 0  Alcohol Screen: Low Risk (04/05/2021)   Alcohol Screen    Last Alcohol Screening Score (AUDIT): 3  Housing: Unknown (12/06/2023)   Epic    Unable to Pay for Housing in the Last Year: No    Number of  Times Moved in the Last Year: Not on file    Homeless in the Last Year: No  Utilities: Not At Risk (12/06/2023)   Epic    Threatened with loss of utilities: No  Health Literacy: Adequate  Health Literacy (12/06/2023)   B1300 Health Literacy    Frequency of need for help with medical instructions: Never    Outpatient Medications Prior to Visit  Medication Sig Dispense Refill   acetaminophen  (TYLENOL ) 500 MG tablet Take 2 tablets (1,000 mg total) by mouth every 6 (six) hours as needed for moderate pain or mild pain. 60 tablet 0   ALPRAZolam  (XANAX ) 0.5 MG tablet TAKE 1/2 TO 1 TABLET BY MOUTH TWICE DAILY AS NEEDED FOR ANXIETY 180 tablet 1   Biotin w/ Vitamins C & E (HAIR/SKIN/NAILS PO) Take 1 tablet by mouth daily.     cetirizine (ZYRTEC) 10 MG tablet Take 10 mg by mouth in the morning. (Patient taking differently: Take 10 mg by mouth as needed.)     cholecalciferol (VITAMIN D ) 1000 units tablet Take 1,000 Units by mouth daily.     vitamin C (ASCORBIC ACID) 500 MG tablet Take 500 mg by mouth daily.     Vitamin D -Vitamin K (VITAMIN K2-VITAMIN D3 PO) Take 2 tablets by mouth daily.     Calcium -Vitamin D  500-3.125 MG-MCG TABS Take 1 tablet by mouth daily.     Facility-Administered Medications Prior to Visit  Medication Dose Route Frequency Provider Last Rate Last Admin   0.9 %  sodium chloride  infusion   Intravenous Continuous Orpha Asberry RAMAN, MD        Allergies[1]  Review of Systems  Constitutional:  Negative for appetite change, chills, fatigue and fever.  HENT:  Negative for congestion, dental problem, ear pain and sore throat.   Eyes:  Negative for discharge, redness and visual disturbance.  Respiratory:  Negative for cough, chest tightness, shortness of breath and wheezing.   Cardiovascular:  Negative for chest pain, palpitations and leg swelling.  Gastrointestinal:  Negative for abdominal pain, blood in stool, diarrhea, nausea and vomiting.  Genitourinary:  Negative for difficulty  urinating, dysuria, flank pain, frequency, hematuria and urgency.  Musculoskeletal:  Negative for arthralgias, back pain, joint swelling, myalgias and neck stiffness.  Skin:  Negative for pallor and rash.  Neurological:  Negative for dizziness, speech difficulty, weakness and headaches.  Hematological:  Negative for adenopathy. Does not bruise/bleed easily.  Psychiatric/Behavioral:  Negative for confusion and sleep disturbance. The patient is not nervous/anxious.     PE;    03/07/2024    1:25 PM 12/06/2023   12:42 PM 08/30/2023   11:41 AM  Vitals with BMI  Height 5' 4 5' 4   Weight 120 lbs 122 lbs   BMI 20.59 20.93   Systolic 124 -- 154  Diastolic 70 -- 73  Pulse 93  55  Exam chaperoned by Bobbetta Degree, CMA.  Gen: Alert, well appearing.  Patient is oriented to person, place, time, and situation. AFFECT: pleasant, lucid thought and speech. ENT: Ears: EACs clear, normal epithelium.  TMs with good light reflex and landmarks bilaterally.  Eyes: no injection, icteris, swelling, or exudate.  EOMI, PERRLA. Nose: no drainage or turbinate edema/swelling.  No injection or focal lesion.  Mouth: lips without lesion/swelling.  Oral mucosa pink and moist.  Dentition intact and without obvious caries or gingival swelling.  Oropharynx without erythema, exudate, or swelling.  Neck: supple/nontender.  No LAD, mass, or TM.  Carotid pulses 2+ bilaterally, without bruits. CV: RRR, no m/r/g.   LUNGS: CTA bilat, nonlabored resps, good aeration in all lung fields. ABD: soft, NT, ND, BS normal.  No hepatospenomegaly or mass.  No bruits. EXT: no clubbing, cyanosis, or edema.  Musculoskeletal: no joint  swelling, erythema, warmth, or tenderness.  ROM of all joints intact. Skin - no sores or suspicious lesions or rashes or color changes  Pertinent labs:  Lab Results  Component Value Date   TSH 1.26 04/01/2020   Lab Results  Component Value Date   WBC 7.3 02/22/2023   HGB 14.8 02/22/2023   HCT 45.3  02/22/2023   MCV 96.2 02/22/2023   PLT 262.0 02/22/2023   Lab Results  Component Value Date   CREATININE 0.67 02/22/2023   BUN 15 02/22/2023   NA 142 02/22/2023   K 4.8 02/22/2023   CL 102 02/22/2023   CO2 33 (H) 02/22/2023   Lab Results  Component Value Date   ALT 14 02/22/2023   AST 17 02/22/2023   ALKPHOS 46 02/22/2023   BILITOT 0.5 02/22/2023   Lab Results  Component Value Date   CHOL 188 02/22/2023   Lab Results  Component Value Date   HDL 89.40 02/22/2023   Lab Results  Component Value Date   LDLCALC 77 02/22/2023   Lab Results  Component Value Date   TRIG 111.0 02/22/2023   Lab Results  Component Value Date   CHOLHDL 2 02/22/2023   Last vitamin D  Lab Results  Component Value Date   VD25OH 74.99 04/09/2021   Lab Results  Component Value Date   VITAMINB12 >1500 (H) 05/11/2017   ASSESSMENT AND PLAN:   #1 Health maintenance exam: Reviewed age and gender appropriate health maintenance issues (prudent diet, regular exercise, health risks of tobacco and excessive alcohol, use of seatbelts, fire alarms in home, use of sunscreen).  Also reviewed age and gender appropriate health screening as well as vaccine recommendations. Vaccines: ALL vaccines UTD. Labs:  CBC, c-Met. (Not fasting) Cervical ca screening: via her GYN Breast ca screening: mammogram-> due August 2026. Colon ca screening: next colonoscopy 2029. Osteoporosis, + hx of hip fracture.  She is managed/treated by Dr. Asberry Sinner. Lung cancer screening: She declines.  #2 GAD. Doing well long-term on alprazolam  0.5 mg, 1/2-1 tab twice a day as needed. A new prescription was not needed today. Controlled substance contract up-to-date.  An After Visit Summary was printed and given to the patient.  FOLLOW UP:  No follow-ups on file.  Signed:  Phil Geraldin Habermehl, MD           03/07/2024     [1]  Allergies Allergen Reactions   Latex Rash   "

## 2024-03-07 NOTE — Patient Instructions (Signed)
 Health Maintenance, Female Adopting a healthy lifestyle and getting preventive care are important in promoting health and wellness. Ask your health care provider about: The right schedule for you to have regular tests and exams. Things you can do on your own to prevent diseases and keep yourself healthy. What should I know about diet, weight, and exercise? Eat a healthy diet  Eat a diet that includes plenty of vegetables, fruits, low-fat dairy products, and lean protein. Do not eat a lot of foods that are high in solid fats, added sugars, or sodium. Maintain a healthy weight Body mass index (BMI) is used to identify weight problems. It estimates body fat based on height and weight. Your health care provider can help determine your BMI and help you achieve or maintain a healthy weight. Get regular exercise Get regular exercise. This is one of the most important things you can do for your health. Most adults should: Exercise for at least 150 minutes each week. The exercise should increase your heart rate and make you sweat (moderate-intensity exercise). Do strengthening exercises at least twice a week. This is in addition to the moderate-intensity exercise. Spend less time sitting. Even light physical activity can be beneficial. Watch cholesterol and blood lipids Have your blood tested for lipids and cholesterol at 72 years of age, then have this test every 5 years. Have your cholesterol levels checked more often if: Your lipid or cholesterol levels are high. You are older than 72 years of age. You are at high risk for heart disease. What should I know about cancer screening? Depending on your health history and family history, you may need to have cancer screening at various ages. This may include screening for: Breast cancer. Cervical cancer. Colorectal cancer. Skin cancer. Lung cancer. What should I know about heart disease, diabetes, and high blood pressure? Blood pressure and heart  disease High blood pressure causes heart disease and increases the risk of stroke. This is more likely to develop in people who have high blood pressure readings or are overweight. Have your blood pressure checked: Every 3-5 years if you are 32-37 years of age. Every year if you are 71 years old or older. Diabetes Have regular diabetes screenings. This checks your fasting blood sugar level. Have the screening done: Once every three years after age 24 if you are at a normal weight and have a low risk for diabetes. More often and at a younger age if you are overweight or have a high risk for diabetes. What should I know about preventing infection? Hepatitis B If you have a higher risk for hepatitis B, you should be screened for this virus. Talk with your health care provider to find out if you are at risk for hepatitis B infection. Hepatitis C Testing is recommended for: Everyone born from [birth year] through 1963. Anyone with known risk factors for hepatitis C. Sexually transmitted infections (STIs) Get screened for STIs, including gonorrhea and chlamydia, if: You are sexually active and are younger than 72 years of age. You are older than 72 years of age and your health care provider tells you that you are at risk for this type of infection. Your sexual activity has changed since you were last screened, and you are at increased risk for chlamydia or gonorrhea. Ask your health care provider if you are at risk. Ask your health care provider about whether you are at high risk for HIV. Your health care provider may recommend a prescription medicine to help prevent HIV  infection. If you choose to take medicine to prevent HIV, you should first get tested for HIV. You should then be tested every 3 months for as long as you are taking the medicine. Pregnancy If you are about to stop having your period (premenopausal) and you may become pregnant, seek counseling before you get pregnant. Take 400 to 800  micrograms (mcg) of folic acid every day if you become pregnant. Ask for birth control (contraception) if you want to prevent pregnancy. Osteoporosis and menopause Osteoporosis is a disease in which the bones lose minerals and strength with aging. This can result in bone fractures. If you are 42 years old or older, or if you are at risk for osteoporosis and fractures, ask your health care provider if you should: Be screened for bone loss. Take a calcium  or vitamin D  supplement to lower your risk of fractures. Be given hormone replacement therapy (HRT) to treat symptoms of menopause. Follow these instructions at home: Alcohol use Do not drink alcohol if: Your health care provider tells you not to drink. You are pregnant, may be pregnant, or are planning to become pregnant. If you drink alcohol: Limit how much you have to: 0-1 drink a day. Know how much alcohol is in your drink. In the [place] one drink equals one 12 oz bottle of beer (355 mL), one 5 oz glass of wine (148 mL), or one 1 oz glass of hard liquor (44 mL). Lifestyle Do not use any products that contain nicotine or tobacco. These products include cigarettes, chewing tobacco, and vaping devices, such as e-cigarettes. If you need help quitting, ask your health care provider. Do not use street drugs. Do not share needles. Ask your health care provider for help if you need support or information about quitting drugs. General instructions Schedule regular health, dental, and eye exams. Stay current with your vaccines. Tell your health care provider if: You often feel depressed. You have ever been abused or do not feel safe at home. This information is not intended to replace advice given to you by your health care provider. Make sure you discuss any questions you have with your health care provider. Document Revised: 07/26/2023 Document Reviewed: 06/08/2020 [employer] Patient Education  2025 [employer].

## 2024-03-08 ENCOUNTER — Ambulatory Visit: Payer: Self-pay | Admitting: Family Medicine

## 2024-09-04 ENCOUNTER — Ambulatory Visit: Admitting: Family Medicine

## 2024-12-11 ENCOUNTER — Ambulatory Visit

## 2024-12-11 ENCOUNTER — Encounter: Admitting: Family Medicine
